# Patient Record
Sex: Female | Born: 1960 | Race: White | Hispanic: No | Marital: Married | Smoking: Former smoker
Health system: Southern US, Community
[De-identification: ages and names within clinical notes are randomized; demographics above are authoritative.]

## PROBLEM LIST (undated history)

## (undated) DIAGNOSIS — F32A Depression, unspecified: Secondary | ICD-10-CM

## (undated) DIAGNOSIS — G510 Bell's palsy: Secondary | ICD-10-CM

## (undated) DIAGNOSIS — Z973 Presence of spectacles and contact lenses: Secondary | ICD-10-CM

## (undated) DIAGNOSIS — N83209 Unspecified ovarian cyst, unspecified side: Secondary | ICD-10-CM

## (undated) DIAGNOSIS — E039 Hypothyroidism, unspecified: Secondary | ICD-10-CM

## (undated) DIAGNOSIS — F329 Major depressive disorder, single episode, unspecified: Secondary | ICD-10-CM

## (undated) DIAGNOSIS — R519 Headache, unspecified: Secondary | ICD-10-CM

## (undated) DIAGNOSIS — G43909 Migraine, unspecified, not intractable, without status migrainosus: Secondary | ICD-10-CM

## (undated) DIAGNOSIS — F419 Anxiety disorder, unspecified: Secondary | ICD-10-CM

## (undated) DIAGNOSIS — T7840XA Allergy, unspecified, initial encounter: Secondary | ICD-10-CM

## (undated) DIAGNOSIS — R112 Nausea with vomiting, unspecified: Secondary | ICD-10-CM

## (undated) DIAGNOSIS — E785 Hyperlipidemia, unspecified: Secondary | ICD-10-CM

## (undated) DIAGNOSIS — K219 Gastro-esophageal reflux disease without esophagitis: Secondary | ICD-10-CM

## (undated) DIAGNOSIS — G4700 Insomnia, unspecified: Secondary | ICD-10-CM

## (undated) DIAGNOSIS — R51 Headache: Secondary | ICD-10-CM

## (undated) DIAGNOSIS — K8020 Calculus of gallbladder without cholecystitis without obstruction: Secondary | ICD-10-CM

## (undated) HISTORY — DX: Nausea with vomiting, unspecified: R11.2

## (undated) HISTORY — DX: Gastro-esophageal reflux disease without esophagitis: K21.9

## (undated) HISTORY — PX: WISDOM TOOTH EXTRACTION: SHX21

## (undated) HISTORY — DX: Unspecified ovarian cyst, unspecified side: N83.209

## (undated) HISTORY — DX: Migraine, unspecified, not intractable, without status migrainosus: G43.909

## (undated) HISTORY — DX: Major depressive disorder, single episode, unspecified: F32.9

## (undated) HISTORY — PX: LAPAROSCOPIC ENDOMETRIOSIS FULGURATION: SUR769

## (undated) HISTORY — DX: Insomnia, unspecified: G47.00

## (undated) HISTORY — DX: Anxiety disorder, unspecified: F41.9

## (undated) HISTORY — PX: ABDOMINAL HYSTERECTOMY: SHX81

## (undated) HISTORY — DX: Headache, unspecified: R51.9

## (undated) HISTORY — DX: Bell's palsy: G51.0

## (undated) HISTORY — DX: Depression, unspecified: F32.A

## (undated) HISTORY — DX: Hyperlipidemia, unspecified: E78.5

## (undated) HISTORY — DX: Allergy, unspecified, initial encounter: T78.40XA

## (undated) HISTORY — DX: Hypothyroidism, unspecified: E03.9

## (undated) HISTORY — PX: CHOLECYSTECTOMY: SHX55

## (undated) HISTORY — DX: Headache: R51

## (undated) HISTORY — DX: Calculus of gallbladder without cholecystitis without obstruction: K80.20

## (undated) HISTORY — DX: Presence of spectacles and contact lenses: Z97.3

---

## 1999-03-04 ENCOUNTER — Ambulatory Visit (HOSPITAL_COMMUNITY): Admission: RE | Admit: 1999-03-04 | Discharge: 1999-03-04 | Payer: Self-pay | Admitting: Family Medicine

## 1999-03-04 ENCOUNTER — Encounter: Payer: Self-pay | Admitting: Family Medicine

## 1999-05-12 ENCOUNTER — Encounter (INDEPENDENT_AMBULATORY_CARE_PROVIDER_SITE_OTHER): Payer: Self-pay | Admitting: Specialist

## 1999-05-12 ENCOUNTER — Inpatient Hospital Stay (HOSPITAL_COMMUNITY): Admission: RE | Admit: 1999-05-12 | Discharge: 1999-05-14 | Payer: Self-pay | Admitting: Obstetrics and Gynecology

## 2000-07-12 ENCOUNTER — Ambulatory Visit (HOSPITAL_COMMUNITY): Admission: RE | Admit: 2000-07-12 | Discharge: 2000-07-12 | Payer: Self-pay | Admitting: *Deleted

## 2000-07-12 ENCOUNTER — Encounter: Payer: Self-pay | Admitting: *Deleted

## 2000-08-03 ENCOUNTER — Encounter: Admission: RE | Admit: 2000-08-03 | Discharge: 2000-08-03 | Payer: Self-pay | Admitting: Family Medicine

## 2000-08-03 ENCOUNTER — Encounter: Payer: Self-pay | Admitting: Family Medicine

## 2000-08-21 ENCOUNTER — Encounter: Payer: Self-pay | Admitting: Neurology

## 2000-08-21 ENCOUNTER — Ambulatory Visit (HOSPITAL_COMMUNITY): Admission: RE | Admit: 2000-08-21 | Discharge: 2000-08-21 | Payer: Self-pay | Admitting: Neurology

## 2002-01-21 ENCOUNTER — Other Ambulatory Visit: Admission: RE | Admit: 2002-01-21 | Discharge: 2002-01-21 | Payer: Self-pay | Admitting: Obstetrics and Gynecology

## 2002-09-09 ENCOUNTER — Encounter: Payer: Self-pay | Admitting: Obstetrics and Gynecology

## 2002-09-09 ENCOUNTER — Encounter (INDEPENDENT_AMBULATORY_CARE_PROVIDER_SITE_OTHER): Payer: Self-pay | Admitting: *Deleted

## 2002-09-09 ENCOUNTER — Encounter: Admission: RE | Admit: 2002-09-09 | Discharge: 2002-09-09 | Payer: Self-pay | Admitting: Obstetrics and Gynecology

## 2002-09-11 ENCOUNTER — Other Ambulatory Visit: Admission: RE | Admit: 2002-09-11 | Discharge: 2002-09-11 | Payer: Self-pay | Admitting: Radiology

## 2002-11-05 ENCOUNTER — Encounter: Payer: Self-pay | Admitting: Family Medicine

## 2002-11-05 ENCOUNTER — Ambulatory Visit (HOSPITAL_COMMUNITY): Admission: RE | Admit: 2002-11-05 | Discharge: 2002-11-05 | Payer: Self-pay | Admitting: Family Medicine

## 2003-06-16 ENCOUNTER — Other Ambulatory Visit: Admission: RE | Admit: 2003-06-16 | Discharge: 2003-06-16 | Payer: Self-pay | Admitting: Obstetrics and Gynecology

## 2003-11-04 ENCOUNTER — Ambulatory Visit (HOSPITAL_COMMUNITY): Admission: RE | Admit: 2003-11-04 | Discharge: 2003-11-04 | Payer: Self-pay | Admitting: Family Medicine

## 2005-04-04 ENCOUNTER — Other Ambulatory Visit: Admission: RE | Admit: 2005-04-04 | Discharge: 2005-04-04 | Payer: Self-pay | Admitting: Obstetrics and Gynecology

## 2007-05-22 ENCOUNTER — Encounter: Admission: RE | Admit: 2007-05-22 | Discharge: 2007-05-22 | Payer: Self-pay | Admitting: Obstetrics and Gynecology

## 2007-06-22 ENCOUNTER — Emergency Department (HOSPITAL_COMMUNITY): Admission: EM | Admit: 2007-06-22 | Discharge: 2007-06-22 | Payer: Self-pay | Admitting: Emergency Medicine

## 2010-12-27 ENCOUNTER — Emergency Department (HOSPITAL_COMMUNITY)
Admission: EM | Admit: 2010-12-27 | Discharge: 2010-12-27 | Disposition: A | Discharge status: Home / Self Care | Payer: Managed Care, Other (non HMO) | Attending: Emergency Medicine | Admitting: Emergency Medicine

## 2010-12-27 ENCOUNTER — Emergency Department (HOSPITAL_COMMUNITY): Payer: Managed Care, Other (non HMO)

## 2010-12-27 DIAGNOSIS — R109 Unspecified abdominal pain: Secondary | ICD-10-CM | POA: Insufficient documentation

## 2010-12-27 LAB — DIFFERENTIAL
Basophils Absolute: 0 10*3/uL (ref 0.0–0.1)
Basophils Relative: 0 % (ref 0–1)
Eosinophils Absolute: 0.1 10*3/uL (ref 0.0–0.7)
Eosinophils Relative: 1 % (ref 0–5)
Lymphocytes Relative: 14 % (ref 12–46)
Lymphs Abs: 1.7 10*3/uL (ref 0.7–4.0)
Monocytes Absolute: 0.9 10*3/uL (ref 0.1–1.0)
Monocytes Relative: 7 % (ref 3–12)
Neutro Abs: 9.1 10*3/uL — ABNORMAL HIGH (ref 1.7–7.7)
Neutrophils Relative %: 78 % — ABNORMAL HIGH (ref 43–77)

## 2010-12-27 LAB — BASIC METABOLIC PANEL
BUN: 13 mg/dL (ref 6–23)
Chloride: 106 mEq/L (ref 96–112)
Potassium: 3.8 mEq/L (ref 3.5–5.1)
Sodium: 138 mEq/L (ref 135–145)

## 2010-12-27 LAB — URINALYSIS, ROUTINE W REFLEX MICROSCOPIC
Glucose, UA: NEGATIVE mg/dL
Specific Gravity, Urine: 1.015 (ref 1.005–1.030)
pH: 8.5 — ABNORMAL HIGH (ref 5.0–8.0)

## 2010-12-27 LAB — CBC
HCT: 43 % (ref 36.0–46.0)
Hemoglobin: 15.5 g/dL — ABNORMAL HIGH (ref 12.0–15.0)
MCH: 33 pg (ref 26.0–34.0)
MCHC: 36 g/dL (ref 30.0–36.0)
MCV: 91.5 fL (ref 78.0–100.0)
Platelets: 360 10*3/uL (ref 150–400)
RBC: 4.7 MIL/uL (ref 3.87–5.11)
RDW: 12.2 % (ref 11.5–15.5)
WBC: 11.8 10*3/uL — ABNORMAL HIGH (ref 4.0–10.5)

## 2011-02-13 NOTE — H&P (Addendum)
Margaret, Robles                ACCOUNT NO.:  [id number]  MEDICAL RECORD NO.:  [id number]          PATIENT TYPE:  LOCATION:                                 FACILITY:  PHYSICIAN:  Margaret Robles. Margaret Robles, M.D.DATE OF BIRTH:  1960/11/24  DATE OF ADMISSION: DATE OF DISCHARGE:                             HISTORY & PHYSICAL   CHIEF COMPLAINT:  Left low back pain.  BRIEF HISTORY:  [name] has been followed by Dr. Darrelyn Robles for pain in her left low back and down the left leg since October of last year.  She has been treated conservatively with anti-inflammatories but fortunately without relief.  Dr. Darrelyn Robles obtained an MRI of the lumbar spine that revealed severe spinal stenosis with moderate paracentral disk extrusion at L4-L5.  She now presents for lumbar surgery.  DRUG ALLERGIES:  No known drug allergies.  MEDICATIONS: 1. Losartan/hydrochlorothiazide. 2. TriCor. 3. Os-Cal. 4. Aleve. 5. Advil. 6. Estradiol.  PAST SURGICAL HISTORY:  Partial hysterectomy, cholecystectomy, thyroidectomy, right rotator cuff repair.  She notes that she has had nausea with anesthesia in the past.  PAST MEDICAL HISTORY:  Positive for hypertension and arthritis.  SOCIAL HISTORY:  The patient denies use of alcohol or tobacco products, and she is retired.  Please note the patient does plan to go home following her hospital stay.  FAMILY HISTORY:  Father passed at the age of 58.  He had heart disease. Mother passed at age of 74 of cancer.  REVIEW OF SYSTEMS:  GENERAL:  Negative fevers, chills or weight change. HEENT:  Negative for headache or blurred vision.  RESPIRATORY:  Negative for shortness of breath.  GI:  Negative for reflux disease or ulcer. GU:  Negative for hematuria.  CARDIOVASCULAR:  History of hypertension. No current chest pain.  MUSCULOSKELETAL:  Positive for low back pain. HEMATOPOIETIC:  Negative for bleeding disorders.  Remainder of review of systems noncontributory.  PHYSICAL  EXAMINATION:  VITAL SIGNS:  Pulse 80, respirations 18, blood pressure 122/84 in the left arm. GENERAL:  [name] is alert and oriented x3.  She is well-developed, well-nourished, in no apparent distress.  She is a pleasant 50 year old female accompanied today by her husband. HEENT:  Normocephalic, atraumatic.  Extraocular movements intact.  The patient wears reading glasses and has dentures both on top and bottom. NECK:  Supple.  Full range of motion without lymphadenopathy. CHEST:  Lungs are clear to auscultation bilaterally without wheezes, rhonchi or rales. HEART:  Regular rate and rhythm. ABDOMEN:  Bowel sounds present in all 4 quadrants. EXTREMITIES:  She has some discomfort with knee extension on the left. She has pain with palpation of the lumbar spine.  She also has pain with range of motion of the lumbar spine, especially flexion and extension. NEUROLOGIC:  Decreased sensation on the lateral aspect of the left lower leg and she has some weakness in the left foot dorsiflexors. SKIN:  Unremarkable.  Peripheral vascular, carotid pulses 2+ bilaterally without bruit.  IMAGING:  Again MRI revealed severe canal stenosis at L4-L5 with moderate central and paracentral extrusion.  IMPRESSION:  Spinal stenosis.  PLAN:  Decompressive lumbar surgery  at L4-L5 to be performed by Dr. Darrelyn Robles.     Margaret Robles, PAC   ______________________________ Margaret Robles Margaret Robles, M.D.    LD/MEDQ  D:  02/10/2011  T:  02/10/2011  Job:  [id number]  Electronically Signed by Margaret Robles  on 02/13/2011 03:37:23 PM Electronically Signed by Margaret Robles M.D. on 02/24/2011 07:30:39 AM

## 2011-04-17 ENCOUNTER — Ambulatory Visit (INDEPENDENT_AMBULATORY_CARE_PROVIDER_SITE_OTHER): Payer: Managed Care, Other (non HMO) | Admitting: General Surgery

## 2011-04-17 ENCOUNTER — Encounter (INDEPENDENT_AMBULATORY_CARE_PROVIDER_SITE_OTHER): Payer: Self-pay | Admitting: General Surgery

## 2011-04-17 VITALS — BP 110/88 | HR 80 | Temp 97.6°F | Ht 66.0 in | Wt 174.6 lb

## 2011-04-17 DIAGNOSIS — K8020 Calculus of gallbladder without cholecystitis without obstruction: Secondary | ICD-10-CM

## 2011-04-17 NOTE — Progress Notes (Signed)
Margaret Robles is a 50 y.o. female.    Chief Complaint  Patient presents with  . Other    new pt- eval of GB    HPI HPI This patient was referred for evaluation of right-sided abdominal pain which has been present since April. She has a history of kidney stones and in April presented to the emergency room at [facility] for evaluation. She had been having 4 hours of constant abdominal pain which led to nausea and subsequent vomiting. She had a CT scan of the abdomen at that time which demonstrated a small kidney stone as well as cholelithiasis without evidence of cholecystitis. She was referred for evaluation of the kidney stone and recently saw Dr. Michiel Cowboy for evaluation. She performed an IVP which did not show any kidney stones and she was cleared from urology. She saw Dr. Bevelyn Buckles back, her primary physician, and she was still having right-sided to right upper quadrant abdominal pain which radiated through to her back to her right shoulder blade. She states that this is a steady pain and "constant" which is worse with running and with coffee and fatty foods. She describes it as pressure and pushing and only gets relief with Dilaudid which she takes daily for pain. She denies any fevers. She did have chills with one episode. She denies any jaundice or heartburn. She has been having the nausea and worse pain with certain foods which she has just learned to avoid. She has had 8 pound weight loss due to the fear of these fatty foods. She denies any history of colonoscopy.  Past Medical History  Diagnosis Date  . Gall stones   . Abdominal pain   . Ovarian cyst   . Nausea & vomiting   . Arthritis   . Wears glasses   . Allergy   Depression/anxiety  Past Surgical History  Procedure Date  . Abdominal hysterectomy   . Laparoscopic endometriosis fulguration     Family History  Problem Relation Age of Onset  . Cancer Mother     lung  . Heart disease Father   . Diabetes Father    . Heart disease Brother   . Diabetes Brother     Social History History  Substance Use Topics  . Smoking status: Former Games developer  . Smokeless tobacco: Not on file  . Alcohol Use: Yes    No Known Allergies  Current Outpatient Prescriptions  Medication Sig Dispense Refill  . buPROPion (WELLBUTRIN SR) 150 MG 12 hr tablet Take 150 mg by mouth 3 (three) times daily.        Marland Kitchen HYDROmorphone (DILAUDID) 2 MG tablet Take 2 mg by mouth every 4 (four) hours as needed.        . sertraline (ZOLOFT) 100 MG tablet Daily.        Review of Systems Review of Systems  Constitutional: Negative.   HENT: Positive for congestion. Negative for ear pain and sore throat.   Eyes: Negative.   Respiratory: Negative.  Negative for stridor.   Cardiovascular: Negative.   Gastrointestinal: Negative.   Genitourinary: Positive for flank pain. Negative for urgency, frequency and hematuria.  Musculoskeletal: Positive for myalgias and joint pain. Negative for falls.  Skin: Negative.   Neurological: Negative.   Endo/Heme/Allergies: Negative.   Psychiatric/Behavioral: Negative.     Physical Exam Physical Exam  Constitutional: She is oriented to person, place, and time. She appears well-developed and well-nourished. No distress.  HENT:  Head: Normocephalic and atraumatic.  Mouth/Throat:  No oropharyngeal exudate.  Eyes: EOM are normal. Pupils are equal, round, and reactive to light. Right eye exhibits no discharge. Left eye exhibits no discharge. No scleral icterus.  Neck: Normal range of motion. Neck supple. No tracheal deviation present.  Cardiovascular: Normal rate, regular rhythm and normal heart sounds.   Respiratory: Effort normal and breath sounds normal. No stridor. She has no wheezes. She has no rales.  GI: Soft. Bowel sounds are normal. She exhibits no distension and no mass. There is tenderness. There is no rebound and no guarding.       RUQ tenderness, no murphy's  Musculoskeletal: Normal range of  motion. She exhibits no edema and no tenderness.  Neurological: She is alert and oriented to person, place, and time. She has normal reflexes.  Skin: Skin is warm and dry. No rash noted. She is not diaphoretic. No erythema. No pallor.  Psychiatric: She has a normal mood and affect. Her behavior is normal. Judgment and thought content normal.     Blood pressure 110/88, pulse 80, temperature 97.6 F (36.4 C), height 5\' 6"  (1.676 m), weight 174 lb 9.6 oz (79.198 kg).  Assessment/Plan Abdominal pain in the right upper quadrant which is most likely due to symptomatic cholelithiasis  Her symptoms to sound like they are from symptomatic cholelithiasis. She didn't have any signs of cholecystitis. I discussed with her the options of nonoperative therapy versus surgical intervention with cholecystectomy. We discussed the surgical procedure as well as the risks including infection, bleeding pain scarring, persistent symptoms, diarrhea, injury to bowel or bile ducts, and need for open surgery and she expressed understanding and desires to proceed with laparoscopic cholecystectomy. We will schedule this as soon as possible.  Lodema Pilot DAVID 04/17/2011, 11:52 AM

## 2011-04-18 ENCOUNTER — Other Ambulatory Visit (HOSPITAL_COMMUNITY): Payer: Managed Care, Other (non HMO)

## 2011-04-20 ENCOUNTER — Other Ambulatory Visit (INDEPENDENT_AMBULATORY_CARE_PROVIDER_SITE_OTHER): Payer: Self-pay | Admitting: General Surgery

## 2011-04-20 ENCOUNTER — Encounter (HOSPITAL_COMMUNITY): Payer: Managed Care, Other (non HMO)

## 2011-04-20 LAB — COMPREHENSIVE METABOLIC PANEL
AST: 14 U/L (ref 0–37)
Alkaline Phosphatase: 82 U/L (ref 39–117)
BUN: 16 mg/dL (ref 6–23)
CO2: 30 mEq/L (ref 19–32)
Chloride: 100 mEq/L (ref 96–112)
Creatinine, Ser: 1.08 mg/dL (ref 0.50–1.10)
GFR calc non Af Amer: 54 mL/min — ABNORMAL LOW (ref >60)
Potassium: 4.6 mEq/L (ref 3.5–5.1)
Total Bilirubin: 0.5 mg/dL (ref 0.3–1.2)

## 2011-04-20 LAB — CBC
HCT: 44.3 % (ref 36.0–46.0)
MCV: 93.1 fL (ref 78.0–100.0)
Platelets: 324 10*3/uL (ref 150–400)
RBC: 4.76 MIL/uL (ref 3.87–5.11)
WBC: 6.7 10*3/uL (ref 4.0–10.5)

## 2011-04-20 LAB — DIFFERENTIAL
Basophils Absolute: 0 10*3/uL (ref 0.0–0.1)
Lymphocytes Relative: 32 % (ref 12–46)
Lymphs Abs: 2.2 10*3/uL (ref 0.7–4.0)
Neutro Abs: 3.7 10*3/uL (ref 1.7–7.7)
Neutrophils Relative %: 55 % (ref 43–77)

## 2011-04-20 LAB — SURGICAL PCR SCREEN: Staphylococcus aureus: NEGATIVE

## 2011-04-26 ENCOUNTER — Ambulatory Visit (HOSPITAL_COMMUNITY): Payer: Managed Care, Other (non HMO)

## 2011-04-26 ENCOUNTER — Ambulatory Visit (HOSPITAL_COMMUNITY)
Admission: RE | Admit: 2011-04-26 | Discharge: 2011-04-26 | Disposition: A | Discharge status: Home / Self Care | Payer: Managed Care, Other (non HMO) | Source: Ambulatory Visit | Attending: General Surgery | Admitting: General Surgery

## 2011-04-26 ENCOUNTER — Other Ambulatory Visit (INDEPENDENT_AMBULATORY_CARE_PROVIDER_SITE_OTHER): Payer: Self-pay | Admitting: General Surgery

## 2011-04-26 DIAGNOSIS — K8010 Calculus of gallbladder with chronic cholecystitis without obstruction: Secondary | ICD-10-CM | POA: Insufficient documentation

## 2011-04-26 DIAGNOSIS — F329 Major depressive disorder, single episode, unspecified: Secondary | ICD-10-CM | POA: Insufficient documentation

## 2011-04-26 DIAGNOSIS — F3289 Other specified depressive episodes: Secondary | ICD-10-CM | POA: Insufficient documentation

## 2011-04-26 DIAGNOSIS — Z01812 Encounter for preprocedural laboratory examination: Secondary | ICD-10-CM | POA: Insufficient documentation

## 2011-05-06 NOTE — Op Note (Signed)
NAME:  Margaret Robles, Margaret Robles NO.:  [id number]  MEDICAL RECORD NO.:  [id number]  LOCATION:  [facility]                         FACILITY:  [facility]  PHYSICIAN:  Lodema Pilot, MD       DATE OF BIRTH:  Jun 28, 1961  DATE OF PROCEDURE:  04/26/2011 DATE OF DISCHARGE:  04/26/2011                              OPERATIVE REPORT   PROCEDURE:  Laparoscopic cholecystectomy with intraoperative cholangiogram.  PREOPERATIVE DIAGNOSIS:  Symptomatic cholelithiasis.  POSTOPERATIVE DIAGNOSIS:  Symptomatic cholelithiasis.  SURGEON:  Lodema Pilot, MD  ASSISTANT:  Ollen Gross. Vernell Morgans, MD  ANESTHESIA:  General endotracheal anesthesia with 35 cc of 1% lidocaine with epinephrine, 0.25% Marcaine injected at 50/50 mixture.  FLUIDS:  300 cc of crystalloid.  ESTIMATED BLOOD LOSS:  Minimal.  DRAINS:  None.  SPECIMENS:  Gallbladder and contents sent to Pathology for permanent sectioning.  FINDINGS:  Thickened peritoneum with some omental adhesions overlying the gallbladder, consistent with chronic cholecystitis, short cystic duct. Otherwise, normal cholangiogram with no filling defects in the common bile duct and free flow of bile into the duodenum, right and left hepatic ducts visualized.  INDICATION FOR PROCEDURE:  [name] is a 50 year old female with right upper quadrant pain, associated with eating and she had ultrasound, consistent with symptomatic cholelithiasis.  She had a CT scan which demonstrated cholelithiasis.  She underwent workup for kidney stones and when this was negative, her right-sided pain radiating to her shoulder was thought to be due to her symptomatic cholelithiasis.  OPERATIVE DETAILS:  [name] was seen and evaluated in the preoperative area and risks and benefits of procedure were again discussed in lay terms and informed consent was obtained.  Prophylactic antibiotics were given and she was taken to the operating room, placed on table in a supine position.   General endotracheal anesthesia was obtained and her abdomen was prepped and draped in a standard surgical fashion.  Her prior infraumbilical incision was used to access the abdomen in Hasson technique and pneumoperitoneum was obtained. Laparoscope was introduced in the abdomen and there was no evidence of bowel injury upon entry.  Two 5 mm right upper quadrant trocars were placed and an 11 mm epigastric trocar was placed under direct visualization and there were omental fatty adhesions adhered to the liver capsule, surrounding the gallbladder and the gallbladder was seen, had a thickened rind, consistent with chronic cholecystitis. Gallbladder was retracted cephalad and the omental adhesions were divided with sharp dissection.  As the gallbladder was retracted cephalad, the peritoneum overlying the gallbladder was taken down with blunt dissection.  This allowed [place] to visualize the cystic duct and we skeletonized the cystic duct and the triangle of Calot with a fairly small cystic duct, short in appearance, and also small in caliber. After this was skeletonized and a critical view of safety was obtained, a clip was placed on the gallbladder and small cystic ductotomy was made and a cholangiogram was performed. Through a separate stab incision, catheter was placed into the cystic duct.  Cholangiogram revealed a short cystic duct, but no filling defects in the common bile duct and free flow of bile in the duodenum and normal filling in the right  and left hepatic ducts.  The cholangiogram catheter was removed and 2 clips were placed on each side of the cystic duct and the duct was transected. Small artery coursing up to the gallbladder was divided between clips and the gallbladder was further dissected free from the gallbladder fossa.  There was another larger cystic artery which was identified coursing up onto the gallbladder and this was transected between Hemoclips.  The gallbladder was  removed from the gallbladder fossa using Bovie electrocautery. There was some leakage of bile from the gallbladder during the dissection, but the gallbladder was completely removed from the gallbladder fossa and the gallbladder fossa was noted to be hemostatic.  It was removed from the abdomen through the umbilicus in an EndoCatch bag.  It was opened up on the back table, examined, and noted to have 2 moderate size gallstones with a single cystic duct. Specimen was passed off the table and sent to Pathology for permanent sectioning.  The right upper quadrant was irrigated with sterile saline solution until the irrigation returned clear and again the gallbladder fossa was noted to be hemostatic.  The right upper quadrant trocars were removed under direct visualization and the abdominal wall noted to be hemostatic.  The umbilical trocars removed and the fascia was approximated with interrupted 0 Vicryl sutures.  The sutures were secured and the abdomen was re-insufflated with a gas and the umbilical trocar closure was inspected and no evidence of bowel injury was identified with good closure of the fascia and the right upper quadrant was noted to hemostatic.  No evidence of bowel injury or bleeding.  The epigastric trocar was removed and the skin was anesthetized with 35 cc of 1% lidocaine with epinephrine and 0.25% Marcaine in a 50/50 mixture. Skin edges were approximated with a 4-0 Monocryl subcuticular suture. Skin was washed and dried and Dermabond was applied.  All sponge, needle, and instrument counts were correct at the end of the case and the patient tolerated the procedure well without apparent complications.          ______________________________ Lodema Pilot, MD     BL/MEDQ  D:  04/26/2011  T:  04/27/2011  Job:  [id number]  Electronically Signed by Lodema Pilot DO on 05/06/2011 06:01:58 PM

## 2011-05-16 ENCOUNTER — Encounter (INDEPENDENT_AMBULATORY_CARE_PROVIDER_SITE_OTHER): Payer: Self-pay | Admitting: General Surgery

## 2011-05-16 ENCOUNTER — Ambulatory Visit (INDEPENDENT_AMBULATORY_CARE_PROVIDER_SITE_OTHER): Payer: Managed Care, Other (non HMO) | Admitting: General Surgery

## 2011-05-16 VITALS — BP 128/74 | HR 58 | Temp 97.6°F

## 2011-05-16 DIAGNOSIS — Z4889 Encounter for other specified surgical aftercare: Secondary | ICD-10-CM

## 2011-05-16 DIAGNOSIS — Z5189 Encounter for other specified aftercare: Secondary | ICD-10-CM

## 2011-05-16 NOTE — Progress Notes (Signed)
Subjective:     Patient ID: Margaret Robles, female   DOB: [birth year], 50 y.o.   MRN: [id number]  HPI This patient follows up 3 weeks status post left upper cholecystectomy. She states she is doing very well and has no food intolerances. Her symptoms preoperative have now resolved. She states that she is eating well with no limitations and her bowels are functioning normal. She is returned to the gym and denies any pain. Her pathology was benign.  Review of Systems     Objective:   Physical Exam No acute chest and nontoxic appearing  Her abdomen is soft, nontender, nondistended, incisions are healing well without infection.    Assessment:     S/p lap cholecystectomy, doing well    Plan:     She can return to activity as tolerated. No limitations. Pathology is benign. Followup p.r.n.

## 2011-12-09 ENCOUNTER — Emergency Department (HOSPITAL_COMMUNITY)
Admission: EM | Admit: 2011-12-09 | Discharge: 2011-12-09 | Disposition: A | Discharge status: Home / Self Care | Payer: 59 | Attending: Emergency Medicine | Admitting: Emergency Medicine

## 2011-12-09 ENCOUNTER — Encounter (HOSPITAL_COMMUNITY): Payer: Self-pay

## 2011-12-09 DIAGNOSIS — R112 Nausea with vomiting, unspecified: Secondary | ICD-10-CM | POA: Insufficient documentation

## 2011-12-09 DIAGNOSIS — R51 Headache: Secondary | ICD-10-CM | POA: Insufficient documentation

## 2011-12-09 MED ORDER — ONDANSETRON HCL 4 MG/2ML IJ SOLN
INTRAMUSCULAR | Status: AC
  Administered 2011-12-09: 4 mg
  Filled 2011-12-09: qty 2

## 2011-12-09 MED ORDER — KETOROLAC TROMETHAMINE 30 MG/ML IJ SOLN
30.0000 mg | Freq: Once | INTRAMUSCULAR | Status: AC
  Administered 2011-12-09: 30 mg via INTRAVENOUS
  Filled 2011-12-09: qty 1

## 2011-12-09 MED ORDER — SODIUM CHLORIDE 0.9 % IV BOLUS (SEPSIS)
1000.0000 mL | Freq: Once | INTRAVENOUS | Status: AC
  Administered 2011-12-09: 1000 mL via INTRAVENOUS

## 2011-12-09 MED ORDER — DIPHENHYDRAMINE HCL 50 MG/ML IJ SOLN
25.0000 mg | Freq: Once | INTRAMUSCULAR | Status: AC
  Administered 2011-12-09: 25 mg via INTRAVENOUS
  Filled 2011-12-09: qty 1

## 2011-12-09 MED ORDER — HYDROCODONE-ACETAMINOPHEN 5-325 MG PO TABS: 1.0000 | ORAL_TABLET | ORAL | Status: AC | PRN

## 2011-12-09 MED ORDER — HYDROMORPHONE HCL PF 1 MG/ML IJ SOLN
1.0000 mg | Freq: Once | INTRAMUSCULAR | Status: AC
  Administered 2011-12-09: 1 mg via INTRAVENOUS
  Filled 2011-12-09: qty 1

## 2011-12-09 MED ORDER — ONDANSETRON HCL 4 MG/2ML IJ SOLN: 4.0000 mg | Freq: Once | INTRAMUSCULAR | Status: DC

## 2011-12-09 MED ORDER — PROMETHAZINE HCL 25 MG PO TABS: 12.5000 mg | ORAL_TABLET | Freq: Four times a day (QID) | ORAL | Status: AC | PRN

## 2011-12-09 NOTE — Discharge Instructions (Signed)
Drink lot of fluids. Use for pain and nausea medicine as needed. Follow up with her doctor.    Headache, General, Unknown Cause The specific cause of your headache may not have been found today. There are many causes and types of headache. A few common ones are:  Tension headache.   Migraine.   Infections (examples: dental and sinus infections).   Bone and/or joint problems in the neck or jaw.   Depression.   Eye problems.  These headaches are not life threatening.  Headaches can sometimes be diagnosed by a patient history and a physical exam. Sometimes, lab and imaging studies (such as x-ray and/or CT scan) are used to rule out more serious problems. In some cases, a spinal tap (lumbar puncture) may be requested. There are many times when your exam and tests may be normal on the first visit even when there is a serious problem causing your headaches. Because of that, it is very important to follow up with your doctor or local clinic for further evaluation. FINDING OUT THE RESULTS OF TESTS  If a radiology test was performed, a radiologist will review your results.   You will be contacted by the emergency department or your physician if any test results require a change in your treatment plan.   Not all test results may be available during your visit. If your test results are not back during the visit, make an appointment with your caregiver to find out the results. Do not assume everything is normal if you have not heard from your caregiver or the medical facility. It is important for you to follow up on all of your test results.  HOME CARE INSTRUCTIONS   Keep follow-up appointments with your caregiver, or any specialist referral.   Only take over-the-counter or prescription medicines for pain, discomfort, or fever as directed by your caregiver.   Biofeedback, massage, or other relaxation techniques may be helpful.   Ice packs or heat applied to the head and neck can be used. Do  this three to four times per day, or as needed.   Call your doctor if you have any questions or concerns.   If you smoke, you should quit.  SEEK MEDICAL CARE IF:   You develop problems with medications prescribed.   You do not respond to or obtain relief from medications.   You have a change from the usual headache.   You develop nausea or vomiting.  SEEK IMMEDIATE MEDICAL CARE IF:   If your headache becomes severe.   You have an unexplained oral temperature above 102 F (38.9 C), or as your caregiver suggests.   You have a stiff neck.   You have loss of vision.   You have muscular weakness.   You have loss of muscular control.   You develop severe symptoms different from your first symptoms.   You start losing your balance or have trouble walking.   You feel faint or pass out.  MAKE SURE YOU:   Understand these instructions.   Will watch your condition.   Will get help right away if you are not doing well or get worse.  Document Released: 09/11/2005 Document Revised: 08/31/2011 Document Reviewed: 04/30/2008 [facility] Patient Information 2012 [place], [place].

## 2011-12-09 NOTE — ED Provider Notes (Signed)
History     CSN: [id number]  Arrival date & time 12/09/11  0136   First MD Initiated Contact with Patient 12/09/11 0220      Chief Complaint  Patient presents with  . Migraine    (Consider location/radiation/quality/duration/timing/severity/associated sxs/prior treatment) HPI Margaret Robles is a 51 y.o. female who presents to the Emergency Department complaining of headache, nausea and vomiting that began three hours ago. Patient states headache had been present for several hours and got progressively worse resulting in nausea and vomiting. It is associated with a throbbing quality, light sensitivity and noise sensitivity. She denies fever, chills, stiff neck, vision changes, hearing changes, trouble speaking or swallowing, numbness, tingling, weakness.  Past Medical History  Diagnosis Date  . Gall stones   . Abdominal pain   . Ovarian cyst   . Nausea & vomiting   . Arthritis   . Wears glasses   . Allergy     Past Surgical History  Procedure Date  . Abdominal hysterectomy   . Laparoscopic endometriosis fulguration   . Cholecystectomy     Family History  Problem Relation Age of Onset  . Cancer Mother     lung  . Heart disease Father   . Diabetes Father   . Heart disease Brother   . Diabetes Brother     History  Substance Use Topics  . Smoking status: Former Games developer  . Smokeless tobacco: Not on file  . Alcohol Use: Yes    OB History    Grav Para Term Preterm Abortions TAB SAB Ect Mult Living                  Review of Systems  Constitutional: Negative for fever.       10 Systems reviewed and are negative for acute change except as noted in the HPI.  HENT: Negative for congestion.   Eyes: Negative for discharge and redness.  Respiratory: Negative for cough and shortness of breath.   Cardiovascular: Negative for chest pain.  Gastrointestinal: Positive for nausea and vomiting. Negative for abdominal pain.  Musculoskeletal: Negative for back pain.  Skin:  Negative for rash.  Neurological: Positive for headaches. Negative for syncope and numbness.  Psychiatric/Behavioral:       No behavior change.    Allergies  Review of patient's allergies indicates no known allergies.  Home Medications   Current Outpatient Rx  Name Route Sig Dispense Refill  . BUPROPION HCL ER (SR) 150 MG PO TB12 Oral Take 150 mg by mouth 3 (three) times daily.      . SERTRALINE HCL 100 MG PO TABS  Daily.    Marland Kitchen HYDROCODONE-ACETAMINOPHEN 5-325 MG PO TABS Oral Take 1 tablet by mouth every 4 (four) hours as needed for pain. 15 tablet 0  . HYDROMORPHONE HCL 2 MG PO TABS Oral Take 2 mg by mouth every 4 (four) hours as needed.      Marland Kitchen PROMETHAZINE HCL 25 MG PO TABS Oral Take 0.5 tablets (12.5 mg total) by mouth every 6 (six) hours as needed for nausea. 10 tablet 0    There were no vitals taken for this visit.  Physical Exam Physical examination:  Nursing notes reviewed; Vital signs and O2 SAT reviewed;  Constitutional: Well developed, Well nourished, Well hydrated, In no acute distress; Head:  Normocephalic, atraumatic; Eyes: EOMI, PERRL, No scleral icterus; ENMT: Mouth and pharynx normal, Mucous membranes moist; Neck: Supple, Full range of motion, No lymphadenopathy; Cardiovascular: Regular rate and rhythm, No murmur, rub,  or gallop; Respiratory: Breath sounds clear & equal bilaterally, No rales, rhonchi, wheezes, or rub, Normal respiratory effort/excursion; Chest: Nontender, Movement normal; Abdomen: Soft, Nontender, Nondistended, Normal bowel sounds; Genitourinary: No CVA tenderness; Extremities: Pulses normal, No tenderness, No edema, No calf edema or asymmetry.; Neuro: AA&Ox3, Major CN grossly intact.  No gross focal motor or sensory deficits in extremities.; Skin: Color normal, Warm, Dry  ED Course  Procedures (including critical care time)    1. Headache       MDM  Patient with headache similar to past headaches. Given IVF, analgesic, antiemetic, benadryl, toradol,  with relief. Patient has taken PO fluids.  Pt feels improved after observation and/or treatment in ED.Pt stable in ED with no significant deterioration in condition.The patient appears reasonably screened and/or stabilized for discharge and I doubt any other medical condition or other [facility] requiring further screening, evaluation, or treatment in the ED at this time prior to discharge.  MDM Reviewed: nursing note and vitals           Nicoletta Dress. Colon Branch, MD 12/09/11 [phone]

## 2011-12-09 NOTE — ED Notes (Signed)
Migraine headache with nausea and vomiting

## 2012-04-23 ENCOUNTER — Other Ambulatory Visit: Payer: Self-pay | Admitting: Family Medicine

## 2012-04-23 DIAGNOSIS — Z1231 Encounter for screening mammogram for malignant neoplasm of breast: Secondary | ICD-10-CM

## 2012-05-07 ENCOUNTER — Ambulatory Visit
Admission: RE | Admit: 2012-05-07 | Discharge: 2012-05-07 | Disposition: A | Discharge status: Home / Self Care | Payer: 59 | Source: Ambulatory Visit | Attending: Family Medicine | Admitting: Family Medicine

## 2012-05-07 DIAGNOSIS — Z1231 Encounter for screening mammogram for malignant neoplasm of breast: Secondary | ICD-10-CM

## 2012-12-06 ENCOUNTER — Other Ambulatory Visit: Payer: Self-pay | Admitting: Neurosurgery

## 2012-12-06 DIAGNOSIS — M792 Neuralgia and neuritis, unspecified: Secondary | ICD-10-CM

## 2012-12-17 ENCOUNTER — Telehealth: Payer: Self-pay | Admitting: Nurse Practitioner

## 2012-12-17 NOTE — Telephone Encounter (Signed)
wtbs

## 2012-12-17 NOTE — Telephone Encounter (Signed)
Wants appt for vaginal pain appt given

## 2012-12-18 ENCOUNTER — Ambulatory Visit (INDEPENDENT_AMBULATORY_CARE_PROVIDER_SITE_OTHER): Payer: 59 | Admitting: Nurse Practitioner

## 2012-12-18 ENCOUNTER — Encounter: Payer: Self-pay | Admitting: Nurse Practitioner

## 2012-12-18 ENCOUNTER — Ambulatory Visit
Admission: RE | Admit: 2012-12-18 | Discharge: 2012-12-18 | Disposition: A | Discharge status: Home / Self Care | Payer: 59 | Source: Ambulatory Visit | Attending: Neurosurgery | Admitting: Neurosurgery

## 2012-12-18 VITALS — BP 121/75 | HR 84 | Temp 98.1°F | Ht 65.5 in | Wt 188.0 lb

## 2012-12-18 DIAGNOSIS — E785 Hyperlipidemia, unspecified: Secondary | ICD-10-CM

## 2012-12-18 DIAGNOSIS — F411 Generalized anxiety disorder: Secondary | ICD-10-CM | POA: Insufficient documentation

## 2012-12-18 DIAGNOSIS — G43909 Migraine, unspecified, not intractable, without status migrainosus: Secondary | ICD-10-CM

## 2012-12-18 DIAGNOSIS — F329 Major depressive disorder, single episode, unspecified: Secondary | ICD-10-CM

## 2012-12-18 DIAGNOSIS — F32A Depression, unspecified: Secondary | ICD-10-CM | POA: Insufficient documentation

## 2012-12-18 DIAGNOSIS — N9089 Other specified noninflammatory disorders of vulva and perineum: Secondary | ICD-10-CM

## 2012-12-18 DIAGNOSIS — M792 Neuralgia and neuritis, unspecified: Secondary | ICD-10-CM

## 2012-12-18 MED ORDER — TRIAMCINOLONE ACETONIDE 0.1 % EX CREA: TOPICAL_CREAM | Freq: Two times a day (BID) | CUTANEOUS | Status: DC

## 2012-12-18 NOTE — Progress Notes (Signed)
  Subjective:    Patient ID: Margaret Robles, female    DOB: [birth year], 52 y.o.   MRN: [id number]  HPI Patient in complaining of pain Vaginal Area. Started 2weekago. constant. Rates pain 7/10. Nothing Helps pain. clothing increases pain.      Review of Systems  Constitutional: Negative.   HENT: Negative.   Eyes: Negative.   Respiratory: Negative.   Cardiovascular: Negative.   Gastrointestinal: Negative.   Endocrine: Negative.   Genitourinary: Positive for vaginal discharge (watery) and vaginal pain. Negative for dysuria.       Vaginal odor  Allergic/Immunologic: Negative.   Neurological: Negative.   Hematological: Negative.   Psychiatric/Behavioral: Negative.         Objective:   Physical Exam  Constitutional: She is oriented to person, place, and time. She appears well-developed and well-nourished.  Cardiovascular: Normal rate, regular rhythm, normal heart sounds and intact distal pulses.   Pulmonary/Chest: Effort normal and breath sounds normal.  Genitourinary: No vaginal discharge found.  Labial adhesions bilaterally with tearing of adhesions on left. Lichen sclerosis noted bil labia  Musculoskeletal: Normal range of motion.  Neurological: She is alert and oriented to person, place, and time.  Skin: Skin is warm and dry.  Psychiatric: She has a normal mood and affect. Her behavior is normal. Judgment and thought content normal.    BP 121/75  Pulse 84  Temp(Src) 98.1 F (36.7 C) (Oral)  Ht 5' 5.5" (1.664 m)  Wt 188 lb (85.276 kg)  BMI 30.8 kg/m2  LMP 10/20/1998       Assessment & Plan:  Labial adhesions bil with lichen sclerosis Triamcinolone cream apply BID Avoid scratching F/U in 1 week  Mary-Margaret Daphine Deutscher, FNP

## 2012-12-18 NOTE — Patient Instructions (Signed)
Avoid scratching Cream as RX Re check at CPE visit

## 2012-12-31 ENCOUNTER — Encounter: Payer: Self-pay | Admitting: Internal Medicine

## 2012-12-31 ENCOUNTER — Ambulatory Visit (INDEPENDENT_AMBULATORY_CARE_PROVIDER_SITE_OTHER): Payer: 59 | Admitting: Nurse Practitioner

## 2012-12-31 ENCOUNTER — Encounter: Payer: Self-pay | Admitting: Nurse Practitioner

## 2012-12-31 VITALS — BP 116/78 | HR 77 | Temp 97.2°F | Ht 66.0 in | Wt 181.0 lb

## 2012-12-31 DIAGNOSIS — Z124 Encounter for screening for malignant neoplasm of cervix: Secondary | ICD-10-CM

## 2012-12-31 DIAGNOSIS — Z139 Encounter for screening, unspecified: Secondary | ICD-10-CM

## 2012-12-31 DIAGNOSIS — Z0000 Encounter for general adult medical examination without abnormal findings: Secondary | ICD-10-CM

## 2012-12-31 LAB — POCT CBC
Granulocyte percent: 60.6 %G (ref 37–80)
Lymph, poc: 1.7 (ref 0.6–3.4)
MCV: 92.3 fL (ref 80–97)
MPV: 7.8 fL (ref 0–99.8)
POC Granulocyte: 3.4 (ref 2–6.9)
POC LYMPH PERCENT: 30.9 %L (ref 10–50)
Platelet Count, POC: 345 10*3/uL (ref 142–424)
RBC: 4.6 M/uL (ref 4.04–5.48)
RDW, POC: 12.5 %
WBC: 5.6 10*3/uL (ref 4.6–10.2)

## 2012-12-31 LAB — POCT URINALYSIS DIPSTICK
Blood, UA: NEGATIVE
Glucose, UA: NEGATIVE
Ketones, UA: NEGATIVE
Spec Grav, UA: <=1.005
Urobilinogen, UA: NEGATIVE

## 2012-12-31 LAB — COMPLETE METABOLIC PANEL WITH GFR
ALT: 22 U/L (ref 0–35)
AST: 21 U/L (ref 0–37)
Albumin: 4.7 g/dL (ref 3.5–5.2)
Alkaline Phosphatase: 82 U/L (ref 39–117)
BUN: 14 mg/dL (ref 6–23)
Calcium: 10.2 mg/dL (ref 8.4–10.5)
Chloride: 105 mEq/L (ref 96–112)
Potassium: 4.7 mEq/L (ref 3.5–5.3)
Sodium: 140 mEq/L (ref 135–145)
Total Protein: 7.4 g/dL (ref 6.0–8.3)

## 2012-12-31 LAB — THYROID PANEL WITH TSH
Free Thyroxine Index: 3.5 (ref 1.0–3.9)
T3 Uptake: 35.8 % (ref 22.5–37.0)
TSH: 5.084 u[IU]/mL — ABNORMAL HIGH (ref 0.350–4.500)

## 2012-12-31 LAB — POCT UA - MICROSCOPIC ONLY
Bacteria, U Microscopic: NEGATIVE
Casts, Ur, LPF, POC: NEGATIVE
Crystals, Ur, HPF, POC: NEGATIVE
Mucus, UA: NEGATIVE
Yeast, UA: NEGATIVE

## 2012-12-31 NOTE — Progress Notes (Signed)
Subjective:    Patient ID: Margaret Robles, female    DOB: 10-13-1960, 52 y.o.   MRN: [id number]  HPI Patient here today for Cpe. She has no complaints today other than her labial adhesions that were found 2 weeks ago. She has been using triamcinolone cream which she says is not helping. No Known Allergies  Outpatient Encounter Prescriptions as of 12/31/2012  Medication Sig Dispense Refill  . buPROPion (WELLBUTRIN SR) 150 MG 12 hr tablet Take 150 mg by mouth 3 (three) times daily.        Marland Kitchen loratadine (CLARITIN) 10 MG tablet Take 10 mg by mouth daily.      . magnesium 30 MG tablet Take 30 mg by mouth 2 (two) times daily.      Marland Kitchen omeprazole (PRILOSEC) 20 MG capsule Take 20 mg by mouth daily.      . sertraline (ZOLOFT) 100 MG tablet Daily.      Marland Kitchen topiramate (TOPAMAX) 100 MG tablet Take 100 mg by mouth 2 (two) times daily.      Marland Kitchen triamcinolone cream (KENALOG) 0.1 % Apply topically 2 (two) times daily.  30 g  2  . HYDROmorphone (DILAUDID) 2 MG tablet Take 2 mg by mouth every 4 (four) hours as needed.         No facility-administered encounter medications on file as of 12/31/2012.    Past Medical History  Diagnosis Date  . Gall stones   . Abdominal pain   . Ovarian cyst   . Nausea & vomiting   . Wears glasses   . Allergy   . Hyperlipidemia   . Depression   . Anxiety     Past Surgical History  Procedure Laterality Date  . Abdominal hysterectomy    . Laparoscopic endometriosis fulguration    . Cholecystectomy      History   Social History  . Marital Status: Married    Spouse Name: N/A    Number of Children: N/A  . Years of Education: N/A   Occupational History  . Not on file.   Social History Main Topics  . Smoking status: Former Games developer  . Smokeless tobacco: Not on file  . Alcohol Use: Yes  . Drug Use: No  . Sexually Active: Yes    Birth Control/ Protection: Surgical   Other Topics Concern  . Not on file   Social History Narrative  . No narrative on file       Review of Systems  Constitutional: Negative.   HENT: Negative.   Eyes: Negative.   Respiratory: Negative.   Cardiovascular: Negative.   Gastrointestinal: Negative.   Endocrine: Negative.   Genitourinary: Negative.   Musculoskeletal: Negative.   Hematological: Negative.   Psychiatric/Behavioral: Negative.        Objective:   Physical Exam  Constitutional: She is oriented to person, place, and time. She appears well-developed and well-nourished.  HENT:  Nose: Nose normal.  Mouth/Throat: Oropharynx is clear and moist.  Eyes: EOM are normal.  Neck: Trachea normal, normal range of motion and full passive range of motion without pain. Neck supple. No JVD present. Carotid bruit is not present. No thyromegaly present.  Cardiovascular: Normal rate, regular rhythm, normal heart sounds and intact distal pulses.  Exam reveals no gallop and no friction rub.   No murmur heard. Pulmonary/Chest: Effort normal and breath sounds normal.  Abdominal: Soft. Bowel sounds are normal. She exhibits no distension and no mass. There is no tenderness.  Genitourinary: Vagina normal.  Labial  adhesions bil with 2 cm annular open wound  Musculoskeletal: Normal range of motion.  Lymphadenopathy:    She has no cervical adenopathy.  Neurological: She is alert and oriented to person, place, and time. She has normal reflexes.  Skin: Skin is warm and dry.  Psychiatric: She has a normal mood and affect. Her behavior is normal. Judgment and thought content normal.          Assessment & Plan:

## 2012-12-31 NOTE — Patient Instructions (Signed)

## 2013-01-01 ENCOUNTER — Other Ambulatory Visit: Payer: Self-pay | Admitting: Nurse Practitioner

## 2013-01-01 LAB — NMR LIPOPROFILE WITH LIPIDS
Cholesterol, Total: 211 mg/dL — ABNORMAL HIGH (ref <200)
HDL Particle Number: 30.9 umol/L (ref >=30.5)
LDL Particle Number: 1802 nmol/L — ABNORMAL HIGH (ref <1000)
LP-IR Score: 25 (ref <=45)
Large VLDL-P: 0.8 nmol/L (ref <=2.7)
Small LDL Particle Number: 732 nmol/L — ABNORMAL HIGH (ref <=527)
Triglycerides: 112 mg/dL (ref <150)
VLDL Size: 40.1 nm (ref <=46.6)

## 2013-01-01 LAB — PAP IG W/ RFLX HPV ASCU

## 2013-01-01 MED ORDER — LEVOTHYROXINE SODIUM 50 MCG PO TABS: 50.0000 ug | ORAL_TABLET | Freq: Every day | ORAL | Status: DC

## 2013-01-01 MED ORDER — ATORVASTATIN CALCIUM 40 MG PO TABS: 40.0000 mg | ORAL_TABLET | Freq: Every day | ORAL | Status: DC

## 2013-01-12 ENCOUNTER — Emergency Department (HOSPITAL_COMMUNITY): Payer: 59

## 2013-01-12 ENCOUNTER — Encounter (HOSPITAL_COMMUNITY): Payer: Self-pay | Admitting: Cardiology

## 2013-01-12 ENCOUNTER — Emergency Department (HOSPITAL_COMMUNITY)
Admission: EM | Admit: 2013-01-12 | Discharge: 2013-01-12 | Disposition: A | Discharge status: Home / Self Care | Payer: 59 | Attending: Emergency Medicine | Admitting: Emergency Medicine

## 2013-01-12 DIAGNOSIS — E785 Hyperlipidemia, unspecified: Secondary | ICD-10-CM | POA: Insufficient documentation

## 2013-01-12 DIAGNOSIS — G510 Bell's palsy: Secondary | ICD-10-CM

## 2013-01-12 DIAGNOSIS — IMO0002 Reserved for concepts with insufficient information to code with codable children: Secondary | ICD-10-CM | POA: Insufficient documentation

## 2013-01-12 DIAGNOSIS — F329 Major depressive disorder, single episode, unspecified: Secondary | ICD-10-CM | POA: Insufficient documentation

## 2013-01-12 DIAGNOSIS — F3289 Other specified depressive episodes: Secondary | ICD-10-CM | POA: Insufficient documentation

## 2013-01-12 DIAGNOSIS — R51 Headache: Secondary | ICD-10-CM | POA: Insufficient documentation

## 2013-01-12 DIAGNOSIS — Z8742 Personal history of other diseases of the female genital tract: Secondary | ICD-10-CM | POA: Insufficient documentation

## 2013-01-12 DIAGNOSIS — Z8719 Personal history of other diseases of the digestive system: Secondary | ICD-10-CM | POA: Insufficient documentation

## 2013-01-12 DIAGNOSIS — Z87891 Personal history of nicotine dependence: Secondary | ICD-10-CM | POA: Insufficient documentation

## 2013-01-12 DIAGNOSIS — F411 Generalized anxiety disorder: Secondary | ICD-10-CM | POA: Insufficient documentation

## 2013-01-12 DIAGNOSIS — Z79899 Other long term (current) drug therapy: Secondary | ICD-10-CM | POA: Insufficient documentation

## 2013-01-12 DIAGNOSIS — R29810 Facial weakness: Secondary | ICD-10-CM | POA: Insufficient documentation

## 2013-01-12 HISTORY — DX: Bell's palsy: G51.0

## 2013-01-12 LAB — CBC WITH DIFFERENTIAL/PLATELET
Basophils Absolute: 0 10*3/uL (ref 0.0–0.1)
Eosinophils Absolute: 0.4 10*3/uL (ref 0.0–0.7)
Lymphocytes Relative: 36 % (ref 12–46)
Lymphs Abs: 2 10*3/uL (ref 0.7–4.0)
Neutrophils Relative %: 50 % (ref 43–77)
Platelets: 295 10*3/uL (ref 150–400)
RBC: 4.7 MIL/uL (ref 3.87–5.11)
RDW: 12.9 % (ref 11.5–15.5)
WBC: 5.6 10*3/uL (ref 4.0–10.5)

## 2013-01-12 LAB — COMPREHENSIVE METABOLIC PANEL
ALT: 67 U/L — ABNORMAL HIGH (ref 0–35)
AST: 37 U/L (ref 0–37)
Alkaline Phosphatase: 161 U/L — ABNORMAL HIGH (ref 39–117)
CO2: 23 mEq/L (ref 19–32)
GFR calc Af Amer: 82 mL/min — ABNORMAL LOW (ref >90)
Glucose, Bld: 109 mg/dL — ABNORMAL HIGH (ref 70–99)
Potassium: 4.4 mEq/L (ref 3.5–5.1)
Sodium: 141 mEq/L (ref 135–145)
Total Protein: 7.7 g/dL (ref 6.0–8.3)

## 2013-01-12 MED ORDER — METOCLOPRAMIDE HCL 5 MG/ML IJ SOLN
10.0000 mg | Freq: Once | INTRAMUSCULAR | Status: AC
  Administered 2013-01-12: 10 mg via INTRAVENOUS
  Filled 2013-01-12: qty 2

## 2013-01-12 MED ORDER — DIPHENHYDRAMINE HCL 50 MG/ML IJ SOLN
25.0000 mg | Freq: Once | INTRAMUSCULAR | Status: AC
  Administered 2013-01-12: 25 mg via INTRAVENOUS
  Filled 2013-01-12: qty 1

## 2013-01-12 MED ORDER — KETOROLAC TROMETHAMINE 30 MG/ML IJ SOLN
30.0000 mg | Freq: Once | INTRAMUSCULAR | Status: AC
  Administered 2013-01-12: 30 mg via INTRAVENOUS
  Filled 2013-01-12: qty 1

## 2013-01-12 MED ORDER — PREDNISONE 10 MG PO TABS: 10.0000 mg | ORAL_TABLET | Freq: Every day | ORAL | Status: DC

## 2013-01-12 MED ORDER — ACYCLOVIR 400 MG PO TABS: 400.0000 mg | ORAL_TABLET | Freq: Four times a day (QID) | ORAL | Status: DC

## 2013-01-12 NOTE — ED Notes (Signed)
Pt reports for the past couple of days she has had pain behind her right ear. States this morning when she woke up she has some numbness in her face and cheek on that same side. States the area also feels swollen. Reports a hx of migraines. Reports last night she was unable to wink her right eye. States when she was brushing her teeth she was unable to hold water in her mouth on the right side. Reports some tingling in her right arm.

## 2013-01-12 NOTE — ED Notes (Signed)
Pt returned from radiology.

## 2013-01-12 NOTE — Discharge Instructions (Signed)
Return to the ED with any concerns including changes in vision or speech, increased headache, neck pain, fever, weakness in arms or legs, fainting, decreased level of alertness/lethargy, or any other alarming symptoms

## 2013-01-12 NOTE — ED Provider Notes (Signed)
History     CSN: [id number]  Arrival date & time 01/12/13  0944   First MD Initiated Contact with Patient 01/12/13 1026      Chief Complaint  Patient presents with  . Numbness    (Consider location/radiation/quality/duration/timing/severity/associated sxs/prior treatment) HPI Pt presents with weakness of the rigth side of her face.  She noticed this when she awoke this morning.  Felt that her eyelid would not fully close, and when she tried to drink water it dribbled out of the right side.  No weakness of extremities.  Also c/o right sided headache and pain just behind her right ear.  No changes in vision.  No confusion or difficulty with word finding.  No difficulty swallowing.  There are no other associated systemic symptoms, there are no other alleviating or modifying factors.   Past Medical History  Diagnosis Date  . Gall stones   . Abdominal pain   . Ovarian cyst   . Nausea & vomiting   . Wears glasses   . Allergy   . Hyperlipidemia   . Depression   . Anxiety     Past Surgical History  Procedure Laterality Date  . Abdominal hysterectomy    . Laparoscopic endometriosis fulguration    . Cholecystectomy      Family History  Problem Relation Age of Onset  . Cancer Mother     lung  . Heart disease Father   . Diabetes Father   . Heart disease Brother   . Diabetes Brother     History  Substance Use Topics  . Smoking status: Former Games developer  . Smokeless tobacco: Not on file  . Alcohol Use: Yes    OB History   Grav Para Term Preterm Abortions TAB SAB Ect Mult Living                  Review of Systems ROS reviewed and all otherwise negative except for mentioned in HPI  Allergies  Review of patient's allergies indicates no known allergies.  Home Medications   Current Outpatient Rx  Name  Route  Sig  Dispense  Refill  . atorvastatin (LIPITOR) 40 MG tablet   Oral   Take 1 tablet (40 mg total) by mouth daily.   90 tablet   1   . buPROPion (WELLBUTRIN  SR) 150 MG 12 hr tablet   Oral   Take 450 mg by mouth daily.          . Cholecalciferol (VITAMIN D3) 2000 UNITS TABS   Oral   Take 2,000 Units by mouth daily.         Marland Kitchen levothyroxine (SYNTHROID, LEVOTHROID) 50 MCG tablet   Oral   Take 1 tablet (50 mcg total) by mouth daily.   90 tablet   1   . loratadine (CLARITIN) 10 MG tablet   Oral   Take 10 mg by mouth daily.         . magnesium 30 MG tablet   Oral   Take 30 mg by mouth 2 (two) times daily.         . Multiple Vitamin (MULTIVITAMIN WITH MINERALS) TABS   Oral   Take 1 tablet by mouth daily.         Marland Kitchen omeprazole (PRILOSEC) 20 MG capsule   Oral   Take 20 mg by mouth daily.         . Probiotic Product (ALIGN) 4 MG CAPS   Oral   Take 4 mg by mouth  daily.         . sertraline (ZOLOFT) 100 MG tablet   Oral   Take 100 mg by mouth Daily.          Marland Kitchen topiramate (TOPAMAX) 100 MG tablet   Oral   Take 100 mg by mouth 2 (two) times daily.         Marland Kitchen acyclovir (ZOVIRAX) 400 MG tablet   Oral   Take 1 tablet (400 mg total) by mouth 4 (four) times daily.   50 tablet   0   . predniSONE (DELTASONE) 10 MG tablet   Oral   Take 1 tablet (10 mg total) by mouth daily. Take 4 tabs po qD x 3 days, then 3 tabs po qD x 3 days, then 2 tabs po qD x 3 days, then 1 tab po qD x 3 days   30 tablet   0     BP 115/73  Pulse 68  Temp(Src) 98 F (36.7 C) (Oral)  Resp 16  SpO2 100%  LMP 10/20/1998 Vitals reviewed Physical Exam Physical Examination: General appearance - alert, well appearing, and in no distress Mental status - alert, oriented to person, place, and time Eyes - pupils equal and reactive, extraocular eye movements intact Ears- bilateral EACS normal, no lesions, TMS normal bilaterally, no mastoid tenderness or overlying redness Mouth - mucous membranes moist, pharynx normal without lesions Neck - supple, no significant adenopathy, no ttp over neck, 2+ carotid pulses bilaterally, no bruit Chest - clear to  auscultation, no wheezes, rales or rhonchi, symmetric air entry Heart - normal rate, regular rhythm, normal S1, S2, no murmurs, rubs, clicks or gallops Abdomen - soft, nontender, nondistended, no masses or organomegaly Neurological - alert, oriented, unable to close right eyelid fully, facial droop on right, no forehead raise on right side, otherwise cranial nerves intact Extremities - peripheral pulses normal, no pedal edema, no clubbing or cyanosis Skin - normal coloration and turgor, no rashes  ED Course  Procedures (including critical care time)   Date: 01/12/2013  Rate: 71  Rhythm: normal sinus rhythm  QRS Axis: normal  Intervals: normal  ST/T Wave abnormalities: normal  Conduction Disutrbances:none  Narrative Interpretation:   Old EKG Reviewed: none available   Labs Reviewed  CBC WITH DIFFERENTIAL - Abnormal; Notable for the following:    Eosinophils Relative 6 (*)    All other components within normal limits  COMPREHENSIVE METABOLIC PANEL - Abnormal; Notable for the following:    Glucose, Bld 109 (*)    ALT 67 (*)    Alkaline Phosphatase 161 (*)    GFR calc non Af Amer 70 (*)    GFR calc Af Amer 82 (*)    All other components within normal limits   Ct Head Wo Contrast  01/12/2013  *RADIOLOGY REPORT*  Clinical Data: Right face/cheek numbness  CT HEAD WITHOUT CONTRAST  Technique:  Contiguous axial images were obtained from the base of the skull through the vertex without contrast.  Comparison: 06/22/2007  Findings: No evidence of parenchymal hemorrhage or extra-axial fluid collection. No mass lesion, mass effect, or midline shift.  No CT evidence of acute infarction.  Cerebral volume is age appropriate.  No ventriculomegaly.  The visualized paranasal sinuses are essentially clear. The mastoid air cells are unopacified.  No evidence of calvarial fracture.  IMPRESSION: No evidence of acute intracranial abnormality.   Original Report Authenticated By: Charline Bills, M.D.       1. Bell's palsy  MDM  Pt presenting with c/o weakness of right side of face and eyelid not closing since last night.  She has been having pain behind right ear- no signs of mastoiditis, no neck pain, no carotid bruit.  Low suspicion for carotid dissection.  Neuro exam is c/w Bell's Palsy with no forehead raise on right side.  Pt treated for headache and given rx for acyclovir and steroids.  Strict return rpecautions were discussed as well as eye protection.  Given information for follow up with neurology.  Discharged with strict return precautions.  Pt agreeable with plan.       Ethelda Chick, MD 01/13/13 1038

## 2013-01-15 ENCOUNTER — Encounter: Payer: Self-pay | Admitting: *Deleted

## 2013-01-15 ENCOUNTER — Ambulatory Visit (AMBULATORY_SURGERY_CENTER): Payer: 59 | Admitting: *Deleted

## 2013-01-15 VITALS — Ht 67.0 in | Wt 178.6 lb

## 2013-01-15 DIAGNOSIS — Z1211 Encounter for screening for malignant neoplasm of colon: Secondary | ICD-10-CM

## 2013-01-15 MED ORDER — MOVIPREP 100 G PO SOLR: 1.0000 | Freq: Once | ORAL | Status: DC

## 2013-01-15 NOTE — Telephone Encounter (Signed)
Erroneous encounter

## 2013-01-15 NOTE — Progress Notes (Signed)
Denies allergies to eggs or soy products. Denies any complications to sedation or anesthesia.

## 2013-01-16 ENCOUNTER — Encounter: Payer: Self-pay | Admitting: Internal Medicine

## 2013-01-24 ENCOUNTER — Telehealth: Payer: Self-pay | Admitting: Nurse Practitioner

## 2013-01-24 NOTE — Telephone Encounter (Signed)
appt made

## 2013-01-27 ENCOUNTER — Ambulatory Visit (INDEPENDENT_AMBULATORY_CARE_PROVIDER_SITE_OTHER): Payer: 59 | Admitting: Nurse Practitioner

## 2013-01-27 ENCOUNTER — Encounter: Payer: Self-pay | Admitting: Nurse Practitioner

## 2013-01-27 VITALS — BP 136/94 | HR 80 | Temp 97.4°F | Ht 65.5 in | Wt 187.0 lb

## 2013-01-27 DIAGNOSIS — G510 Bell's palsy: Secondary | ICD-10-CM

## 2013-01-27 DIAGNOSIS — G519 Disorder of facial nerve, unspecified: Secondary | ICD-10-CM

## 2013-01-27 MED ORDER — HYDROCODONE-ACETAMINOPHEN 5-325 MG PO TABS: 1.0000 | ORAL_TABLET | Freq: Four times a day (QID) | ORAL | Status: DC | PRN

## 2013-01-27 MED ORDER — GABAPENTIN 100 MG PO CAPS: 100.0000 mg | ORAL_CAPSULE | Freq: Three times a day (TID) | ORAL | Status: DC

## 2013-01-27 NOTE — Patient Instructions (Signed)
Pain Medicine Instructions You have been given a prescription for pain medicines. These medicines may affect your ability to think clearly. They may also affect your ability to perform physical activities. Take these medicines only as needed for pain. You do not need to take them if you are not having pain, unless directed by your caregiver. You can take less than the prescribed dose if you find a smaller amount of medicine controls the pain. It may not be possible to make all of your pain go away, but you should be comfortable enough to move, breathe, and take care of yourself. After you start taking pain medicines, while taking the medicines, and for 8 hours after stopping the medicines:  Do not drive.  Do not operate machinery.  Do not operate power tools.  Do not sign legal documents.  Do not supervise children by yourself.  Do not participate in activities that require climbing or being in high places.  Do not enter a body of water (lake, river, ocean, spa, swimming pool) without an adult nearby who can help you. You may have been prescribed a pain medicine that contains acetaminophen (paracetamol). If so, take only the amount directed by your caregiver. Do not take any other acetaminophen while taking this medicine. An overdose of acetaminophen can result in severe liver damage. If you are taking other medicines, check the active ingredients for acetaminophen. Acetaminophen is found in hundreds of over-the-counter and prescription medicines. These include cold relief products, menstrual cramp relief medicines, fever-reducing medicines, acid indigestion relief products, and pain relief products. HOME CARE INSTRUCTIONS   Do not drink alcohol, take sleeping pills, or take other medicines until at least 8 hours after your last dose of pain medicine, or as directed by your caregiver.  Use a bulk stool softener if you become constipated from your pain medicines. Increasing your intake of fruits  and vegetables will also help.  Write down the times when you take your medicines. Look at the times before taking your next dose of medicine. It is easy to become confused while on pain medicines. Recording the times helps you to avoid an overdose. SEEK MEDICAL CARE IF:  Your medicine is not helping the pain go away.  You vomit or have diarrhea shortly after taking the medicine.  You develop new pain in areas that did not hurt before. SEEK IMMEDIATE MEDICAL CARE IF:  You feel dizzy or faint.  You feel there are other problems that might be caused by your medicine. MAKE SURE YOU:   Understand these instructions.  Will watch your condition.  Will get help right away if you are not doing well or get worse. Document Released: 12/18/2000 Document Revised: 12/04/2011 Document Reviewed: 08/26/2010 [employer] Patient Information 2013 [employer].  

## 2013-01-27 NOTE — Progress Notes (Signed)
  Subjective:    Patient ID: Margaret Robles, female    DOB: 1961-02-09, 52 y.o.   MRN: [id number]  HPI- patient went to ER on Easter Sunday- facial drooping- thought she was having a stroke but was actually Dx with Bells Palsy- Given steroids and acyclovir- Completed course of meds- here for follow up. Patient c/o pain behind right ear and radiates up around ear and up to eye. Steady pain rated 6-7/10 with frequent sharp pains that can " take her to her knees". Heating pad helps some. Aleve doesn't help.    Review of Systems  Neurological: Positive for facial asymmetry and headaches.       Objective:   Physical Exam  Constitutional: She appears well-developed and well-nourished.  Cardiovascular: Normal rate, normal heart sounds and intact distal pulses.   Pulmonary/Chest: Effort normal and breath sounds normal.  Neurological: She has normal reflexes. A cranial nerve deficit is present.  Unable to close right eye lid. Right lip drooping Decrease sensation right cheek and forehead   BP 136/94  Pulse 80  Temp(Src) 97.4 F (36.3 C) (Oral)  Ht 5' 5.5" (1.664 m)  Wt 187 lb (84.823 kg)  BMI 30.63 kg/m2  LMP 10/20/1998        Assessment & Plan:  1. Bell's palsy moist heat  - gabapentin (NEURONTIN) 100 MG capsule; Take 1 capsule (100 mg total) by mouth 3 (three) times daily.  Dispense: 90 capsule; Refill: 3 - HYDROcodone-acetaminophen (NORCO/VICODIN) 5-325 MG per tablet; Take 1 tablet by mouth every 6 (six) hours as needed for pain.  Dispense: 40 tablet; Refill: 0  Mary-Margaret Daphine Deutscher, FNP

## 2013-01-29 ENCOUNTER — Ambulatory Visit (AMBULATORY_SURGERY_CENTER): Payer: 59 | Admitting: Internal Medicine

## 2013-01-29 ENCOUNTER — Encounter: Payer: Self-pay | Admitting: Internal Medicine

## 2013-01-29 VITALS — BP 119/49 | HR 55 | Temp 97.9°F | Resp 19 | Ht 67.0 in | Wt 178.0 lb

## 2013-01-29 DIAGNOSIS — Z1211 Encounter for screening for malignant neoplasm of colon: Secondary | ICD-10-CM

## 2013-01-29 MED ORDER — SODIUM CHLORIDE 0.9 % IV SOLN: 500.0000 mL | INTRAVENOUS | Status: DC

## 2013-01-29 NOTE — Progress Notes (Signed)
Patient did not experience any of the following events: a burn prior to discharge; a fall within the facility; wrong site/side/patient/procedure/implant event; or a hospital transfer or hospital admission upon discharge from the facility. (G8907) Patient did not have preoperative order for IV antibiotic SSI prophylaxis. (G8918)  

## 2013-01-29 NOTE — Patient Instructions (Addendum)
YOU HAD AN ENDOSCOPIC PROCEDURE TODAY AT THE  ENDOSCOPY CENTER: Refer to the procedure report that was given to you for any specific questions about what was found during the examination.  If the procedure report does not answer your questions, please call your gastroenterologist to clarify.  If you requested that your care partner not be given the details of your procedure findings, then the procedure report has been included in a sealed envelope for you to review at your convenience later.  YOU SHOULD EXPECT: Some feelings of bloating in the abdomen. Passage of more gas than usual.  Walking can help get rid of the air that was put into your GI tract during the procedure and reduce the bloating. If you had a lower endoscopy (such as a colonoscopy or flexible sigmoidoscopy) you may notice spotting of blood in your stool or on the toilet paper. If you underwent a bowel prep for your procedure, then you may not have a normal bowel movement for a few days.  DIET: Your first meal following the procedure should be a light meal and then it is ok to progress to your normal diet.  A half-sandwich or bowl of soup is an example of a good first meal.  Heavy or fried foods are harder to digest and may make you feel nauseous or bloated.  Likewise meals heavy in dairy and vegetables can cause extra gas to form and this can also increase the bloating.  Drink plenty of fluids but you should avoid alcoholic beverages for 24 hours.  ACTIVITY: Your care partner should take you home directly after the procedure.  You should plan to take it easy, moving slowly for the rest of the day.  You can resume normal activity the day after the procedure however you should NOT DRIVE or use heavy machinery for 24 hours (because of the sedation medicines used during the test).    SYMPTOMS TO REPORT IMMEDIATELY: A gastroenterologist can be reached at any hour.  During normal business hours, 8:30 AM to 5:00 PM Monday through Friday,  call [phone].  After hours and on weekends, please call the GI answering service at [phone] who will take a message and have the physician on call contact you.   Following lower endoscopy (colonoscopy or flexible sigmoidoscopy):  Excessive amounts of blood in the stool  Significant tenderness or worsening of abdominal pains  Swelling of the abdomen that is new, acute  Fever of 100F or higher   FOLLOW UP: If any biopsies were taken you will be contacted by phone or by letter within the next 1-3 weeks.  Call your gastroenterologist if you have not heard about the biopsies in 3 weeks.  Our staff will call the home number listed on your records the next business day following your procedure to check on you and address any questions or concerns that you may have at that time regarding the information given to you following your procedure. This is a courtesy call and so if there is no answer at the home number and we have not heard from you through the emergency physician on call, we will assume that you have returned to your regular daily activities without incident.  SIGNATURES/CONFIDENTIALITY: You and/or your care partner have signed paperwork which will be entered into your electronic medical record.  These signatures attest to the fact that that the information above on your After Visit Summary has been reviewed and is understood.  Full responsibility of the confidentiality of   this discharge information lies with you and/or your care-partner.   Normal colonoscopy,  Repeat in 10 years-2024    High fiber diet information given.   

## 2013-01-29 NOTE — Op Note (Signed)
[facility] [address] [place], [address]   COLONOSCOPY PROCEDURE REPORT  PATIENT: Margaret Robles, Margaret Robles  MR#: [id number] BIRTHDATE: [birth year] , 51  yrs. old GENDER: Female ENDOSCOPIST: Hart Carwin, MD REFERRED BY:  Rudi Heap, M.D. PROCEDURE DATE:  01/29/2013 PROCEDURE:   Colonoscopy, screening ASA CLASS:   Class I INDICATIONS:Average risk patient for colon cancer. MEDICATIONS: MAC sedation, administered by CRNA and propofol (Diprivan) 250mg  IV  DESCRIPTION OF PROCEDURE:   After the risks and benefits and of the procedure were explained, informed consent was obtained.  A digital rectal exam revealed no abnormalities of the rectum.    The LB [id number]  endoscope was introduced through the anus and advanced to the cecum, which was identified by both the appendix and ileocecal valve .  The quality of the prep was good, using MoviPrep .  The instrument was then slowly withdrawn as the colon was fully examined.     COLON FINDINGS: A normal appearing cecum, ileocecal valve, and appendiceal orifice were identified.  The ascending, hepatic flexure, transverse, splenic flexure, descending, sigmoid colon and rectum appeared unremarkable.  No polyps or cancers were seen. Retroflexed views revealed no abnormalities.     The scope was then withdrawn from the patient and the procedure completed.  COMPLICATIONS: There were no complications. ENDOSCOPIC IMPRESSION: Normal colon  RECOMMENDATIONS: High fiber diet   REPEAT EXAM: In 10 year(s)  for Colonoscopy.  cc:  _______________________________ eSignedHart Carwin, MD 01/29/2013 9:01 AM

## 2013-01-30 ENCOUNTER — Telehealth: Payer: Self-pay

## 2013-01-30 NOTE — Telephone Encounter (Signed)
Left a message on the pt's answering machine to call us back if she has any questions or concerns. Maw

## 2013-02-25 ENCOUNTER — Telehealth: Payer: Self-pay | Admitting: Nurse Practitioner

## 2013-02-25 DIAGNOSIS — E039 Hypothyroidism, unspecified: Secondary | ICD-10-CM

## 2013-02-25 DIAGNOSIS — E785 Hyperlipidemia, unspecified: Secondary | ICD-10-CM

## 2013-02-25 DIAGNOSIS — I10 Essential (primary) hypertension: Secondary | ICD-10-CM

## 2013-02-25 NOTE — Telephone Encounter (Signed)
Orders in computer to have done prior to 03/14/13

## 2013-02-25 NOTE — Telephone Encounter (Signed)
Left detailed message on patient's voice mail.

## 2013-02-27 ENCOUNTER — Other Ambulatory Visit (INDEPENDENT_AMBULATORY_CARE_PROVIDER_SITE_OTHER): Payer: 59

## 2013-02-27 DIAGNOSIS — E039 Hypothyroidism, unspecified: Secondary | ICD-10-CM

## 2013-02-27 DIAGNOSIS — I10 Essential (primary) hypertension: Secondary | ICD-10-CM

## 2013-02-27 DIAGNOSIS — E785 Hyperlipidemia, unspecified: Secondary | ICD-10-CM

## 2013-02-27 LAB — COMPLETE METABOLIC PANEL WITH GFR
ALT: 27 U/L (ref 0–35)
BUN: 15 mg/dL (ref 6–23)
CO2: 23 mEq/L (ref 19–32)
Calcium: 9.2 mg/dL (ref 8.4–10.5)
Chloride: 111 mEq/L (ref 96–112)
Creat: 0.99 mg/dL (ref 0.50–1.10)
GFR, Est African American: 77 mL/min
GFR, Est Non African American: 67 mL/min
Total Bilirubin: 0.4 mg/dL (ref 0.3–1.2)

## 2013-02-27 LAB — THYROID PANEL WITH TSH
T4, Total: 8.5 ug/dL (ref 5.0–12.5)
TSH: 3.736 u[IU]/mL (ref 0.350–4.500)

## 2013-02-27 NOTE — Progress Notes (Signed)
Pt here todady for labs only.

## 2013-02-28 LAB — NMR LIPOPROFILE WITH LIPIDS
HDL Particle Number: 35.4 umol/L (ref >=30.5)
HDL Size: 9.7 nm (ref >=9.2)
HDL-C: 51 mg/dL (ref >=40)
LDL (calc): 56 mg/dL (ref <100)
LDL Particle Number: 877 nmol/L (ref <1000)
LDL Size: 20.8 nm (ref >20.5)
LP-IR Score: 27 (ref <=45)
Small LDL Particle Number: 583 nmol/L — ABNORMAL HIGH (ref <=527)
VLDL Size: 48.7 nm — ABNORMAL HIGH (ref <=46.6)

## 2013-03-04 NOTE — Progress Notes (Signed)
Pt aware of labs  

## 2013-04-21 ENCOUNTER — Other Ambulatory Visit: Payer: Self-pay

## 2013-04-21 MED ORDER — SERTRALINE HCL 100 MG PO TABS: 100.0000 mg | ORAL_TABLET | Freq: Every day | ORAL | Status: DC

## 2013-04-22 ENCOUNTER — Other Ambulatory Visit: Payer: Self-pay

## 2013-04-22 DIAGNOSIS — Z1231 Encounter for screening mammogram for malignant neoplasm of breast: Secondary | ICD-10-CM

## 2013-05-06 ENCOUNTER — Other Ambulatory Visit: Payer: Self-pay

## 2013-05-06 MED ORDER — BUPROPION HCL ER (SR) 150 MG PO TB12: 450.0000 mg | ORAL_TABLET | Freq: Every day | ORAL | Status: DC

## 2013-05-06 MED ORDER — SERTRALINE HCL 100 MG PO TABS: 100.0000 mg | ORAL_TABLET | Freq: Every day | ORAL | Status: DC

## 2013-05-15 ENCOUNTER — Ambulatory Visit (INDEPENDENT_AMBULATORY_CARE_PROVIDER_SITE_OTHER): Payer: 59 | Admitting: Nurse Practitioner

## 2013-05-15 ENCOUNTER — Encounter: Payer: Self-pay | Admitting: Nurse Practitioner

## 2013-05-15 VITALS — BP 123/81 | HR 73 | Temp 97.4°F | Ht 67.0 in | Wt 188.0 lb

## 2013-05-15 DIAGNOSIS — G4700 Insomnia, unspecified: Secondary | ICD-10-CM

## 2013-05-15 DIAGNOSIS — G43909 Migraine, unspecified, not intractable, without status migrainosus: Secondary | ICD-10-CM

## 2013-05-15 MED ORDER — DOXEPIN HCL 3 MG PO TABS: 1.0000 | ORAL_TABLET | Freq: Every evening | ORAL | Status: DC | PRN

## 2013-05-15 MED ORDER — BACLOFEN 10 MG PO TABS: 10.0000 mg | ORAL_TABLET | ORAL | Status: DC | PRN

## 2013-05-15 MED ORDER — SERTRALINE HCL 100 MG PO TABS: 100.0000 mg | ORAL_TABLET | Freq: Every day | ORAL | Status: DC

## 2013-05-15 MED ORDER — TOPIRAMATE 100 MG PO TABS: 100.0000 mg | ORAL_TABLET | Freq: Two times a day (BID) | ORAL | Status: DC

## 2013-05-15 MED ORDER — BUPROPION HCL ER (SR) 150 MG PO TB12: 450.0000 mg | ORAL_TABLET | Freq: Every day | ORAL | Status: DC

## 2013-05-15 NOTE — Progress Notes (Signed)
  Subjective:    Patient ID: Margaret Robles, female    DOB: 1961/02/03, 52 y.o.   MRN: [id number]  HPI 1. Patient in c/o headaches- Goes to DR. Neale Burly and patient doesn't like him- He has her on topamax daily and baclofen prn limited bid 2 x a week- Patient said that when she takes topamax she only needs baclofen 1x per month. Wants me to start taking care of medications. 2. Insomnia- says she thinks the fact  That she is not sleeping is also making her migraines come more often. Has  Tried all OTC sleep aids which do not help.    Review of Systems  All other systems reviewed and are negative.       Objective:   Physical Exam  Constitutional: She appears well-developed and well-nourished.  Cardiovascular: Normal rate and normal heart sounds.   Pulmonary/Chest: Effort normal and breath sounds normal.  Neurological: She is alert. She has normal reflexes. No cranial nerve deficit.    BP 123/81  Pulse 73  Temp(Src) 97.4 F (36.3 C) (Oral)  Ht 5\' 7"  (1.702 m)  Wt 188 lb (85.276 kg)  BMI 29.44 kg/m2  LMP 10/20/1998       Assessment & Plan:  1. Migraines Avoid caffeine - baclofen (LIORESAL) 10 MG tablet; Take 1 tablet (10 mg total) by mouth as needed.  Dispense: 30 each; Refill: 5 - topiramate (TOPAMAX) 100 MG tablet; Take 1 tablet (100 mg total) by mouth 2 (two) times daily.  Dispense: 60 tablet; Refill: 3  2. Insomnia Bedtime ritual - Doxepin HCl 3 MG TABS; Take 1 tablet (3 mg total) by mouth at bedtime and may repeat dose one time if needed.  Dispense: 12 tablet; Refill: 0  Mary-Margaret Daphine Deutscher, FNP

## 2013-05-15 NOTE — Addendum Note (Signed)
Addended by: Bennie Pierini on: 05/15/2013 09:02 AM   Modules accepted: Orders

## 2013-05-15 NOTE — Patient Instructions (Signed)

## 2013-05-19 ENCOUNTER — Ambulatory Visit
Admission: RE | Admit: 2013-05-19 | Discharge: 2013-05-19 | Disposition: A | Discharge status: Home / Self Care | Payer: 59 | Source: Ambulatory Visit

## 2013-05-19 DIAGNOSIS — Z1231 Encounter for screening mammogram for malignant neoplasm of breast: Secondary | ICD-10-CM

## 2013-05-20 ENCOUNTER — Other Ambulatory Visit: Payer: Self-pay | Admitting: *Deleted

## 2013-05-20 MED ORDER — BUPROPION HCL ER (XL) 150 MG PO TB24: ORAL_TABLET | ORAL | Status: DC

## 2013-05-28 ENCOUNTER — Telehealth: Payer: Self-pay | Admitting: Nurse Practitioner

## 2013-05-30 ENCOUNTER — Other Ambulatory Visit: Payer: Self-pay | Admitting: Nurse Practitioner

## 2013-05-30 DIAGNOSIS — G4700 Insomnia, unspecified: Secondary | ICD-10-CM

## 2013-05-30 MED ORDER — DOXEPIN HCL 3 MG PO TABS: ORAL_TABLET | ORAL | Status: DC

## 2013-05-30 NOTE — Telephone Encounter (Signed)
rx sent to pharmacy

## 2013-05-30 NOTE — Telephone Encounter (Signed)
Notified patient that rx sent to pharmacy 

## 2013-06-16 ENCOUNTER — Other Ambulatory Visit: Payer: Self-pay

## 2013-06-16 MED ORDER — LEVOTHYROXINE SODIUM 50 MCG PO TABS: 50.0000 ug | ORAL_TABLET | Freq: Every day | ORAL | Status: DC

## 2013-06-16 MED ORDER — ATORVASTATIN CALCIUM 40 MG PO TABS: 40.0000 mg | ORAL_TABLET | Freq: Every day | ORAL | Status: DC

## 2013-07-31 ENCOUNTER — Other Ambulatory Visit: Payer: Self-pay

## 2013-08-15 ENCOUNTER — Other Ambulatory Visit: Payer: Self-pay | Admitting: *Deleted

## 2013-08-15 DIAGNOSIS — G43909 Migraine, unspecified, not intractable, without status migrainosus: Secondary | ICD-10-CM

## 2013-08-15 MED ORDER — TOPIRAMATE 100 MG PO TABS: 100.0000 mg | ORAL_TABLET | Freq: Two times a day (BID) | ORAL | Status: DC

## 2013-08-26 ENCOUNTER — Ambulatory Visit (HOSPITAL_COMMUNITY)
Admission: RE | Admit: 2013-08-26 | Discharge: 2013-08-26 | Disposition: A | Discharge status: Home / Self Care | Payer: 59 | Source: Ambulatory Visit | Attending: Family Medicine | Admitting: Family Medicine

## 2013-08-26 ENCOUNTER — Encounter: Payer: Self-pay | Admitting: Family Medicine

## 2013-08-26 ENCOUNTER — Encounter: Payer: Self-pay | Admitting: *Deleted

## 2013-08-26 ENCOUNTER — Ambulatory Visit (INDEPENDENT_AMBULATORY_CARE_PROVIDER_SITE_OTHER): Payer: 59 | Admitting: Family Medicine

## 2013-08-26 VITALS — BP 122/80 | HR 90 | Temp 98.6°F | Ht 67.0 in | Wt 185.0 lb

## 2013-08-26 DIAGNOSIS — R220 Localized swelling, mass and lump, head: Secondary | ICD-10-CM

## 2013-08-26 DIAGNOSIS — R4701 Aphasia: Secondary | ICD-10-CM | POA: Insufficient documentation

## 2013-08-26 DIAGNOSIS — R221 Localized swelling, mass and lump, neck: Secondary | ICD-10-CM

## 2013-08-26 MED ORDER — GADOBENATE DIMEGLUMINE 529 MG/ML IV SOLN
15.0000 mL | Freq: Once | INTRAVENOUS | Status: AC | PRN
  Administered 2013-08-26: 15 mL via INTRAVENOUS

## 2013-08-26 MED ORDER — DIAZEPAM 5 MG PO TABS: 5.0000 mg | ORAL_TABLET | Freq: Two times a day (BID) | ORAL | Status: DC | PRN

## 2013-08-26 MED ORDER — IOHEXOL 300 MG/ML  SOLN
100.0000 mL | Freq: Once | INTRAMUSCULAR | Status: AC | PRN
  Administered 2013-08-26: 100 mL via INTRAVENOUS

## 2013-08-26 MED ORDER — IOHEXOL 300 MG/ML  SOLN: 75.0000 mL | Freq: Once | INTRAMUSCULAR | Status: DC | PRN

## 2013-08-26 NOTE — Progress Notes (Signed)
   Subjective:    Patient ID: Margaret Robles, female    DOB: 1961-09-17, 52 y.o.   MRN: [id number]  HPI Pt presents today with chief complaint of right-sided neck mass. Patient states that she initially noticed the mass about one week ago when she was in there with her husband. Patient pulled out the picture from August of this year where she noticed the mass is present as well although she did notice at that time. Mass has been nontender and varied in size being fairly small to fairly large. Patient does put she's had some subjective night sweats over the same time frame although she's not sure if this is menopause. Patient reports she had a hysterectomy several years ago with only one ovary. Patient also reports generalized progressive fatigue as well as uncontrollable weight gain. No bruising. No nausea. Patient does report a positive family history of lung cancer in grandmother and mother. Mother was a nonsmoker and died from lung cancer. The patient has a prior smoking history, quitting 10 years ago. Patient denies any recent infections.    Review of Systems  All other systems reviewed and are negative.       Objective:   Physical Exam  Constitutional: She is oriented to person, place, and time. She appears well-developed and well-nourished.  HENT:  Head: Normocephalic and atraumatic.    Left Ear: External ear normal.  Soft, non tender 4x5 cm palpable mass in R neck No erythema No fluctuance  Eyes: Conjunctivae are normal. Pupils are equal, round, and reactive to light.  Neck: Normal range of motion.  Cardiovascular: Normal rate and regular rhythm.   Pulmonary/Chest: Effort normal and breath sounds normal.  No palpable axillary LAD/mass on R   Abdominal: Soft.  Musculoskeletal: Normal range of motion.  Neurological: She is alert and oriented to person, place, and time.  Skin: Skin is warm.          Assessment & Plan:  Neck mass - Plan: CT Soft Tissue Neck W  Contrast, CT Chest W Contrast, CBC With differential/Platelet, Comprehensive metabolic panel, TSH, Sedimentation rate, HIV Antibody, Viral screen(EBV capsid ab+RSV+CMV ab)  Aphasia - Plan: MR MRA HEAD W WO CONTRAST, Vitamin B12, Vitamin B1  Differential diagnoses for symptoms is extremely broad including infection, inflammatory process, malignancy.  We'll send patient for a CT of the neck and chest with contrast to better assess anatomy. We'll also check baseline labs including a CMP, CBC with peripheral smear. Also check TSH.  Check sedimentation rate, viral serologies including Epstein-Barr and CMV. Check HIV. Given overlapping aphasia we'll check MRI of the brain. No focal neurological deficits on exam today. Anticipate patient needing a fine-needle aspiration of right neck mass. Followup pending blood work and imaging.

## 2013-08-27 ENCOUNTER — Other Ambulatory Visit: Payer: Self-pay | Admitting: *Deleted

## 2013-08-27 DIAGNOSIS — R221 Localized swelling, mass and lump, neck: Secondary | ICD-10-CM

## 2013-08-28 NOTE — Telephone Encounter (Signed)
This encounter was created in error - please disregard.

## 2013-08-29 ENCOUNTER — Ambulatory Visit: Payer: 59 | Admitting: Nurse Practitioner

## 2013-09-01 ENCOUNTER — Other Ambulatory Visit: Payer: Self-pay

## 2013-09-01 DIAGNOSIS — R221 Localized swelling, mass and lump, neck: Secondary | ICD-10-CM

## 2013-09-01 LAB — VITAMIN B1: Vitamin B1 (Thiamine): 19.7 nmol/L (ref 8.1–32.9)

## 2013-09-01 LAB — COMPREHENSIVE METABOLIC PANEL
ALT: 29 IU/L (ref 0–32)
AST: 20 IU/L (ref 0–40)
Albumin/Globulin Ratio: 1.7 (ref 1.1–2.5)
BUN/Creatinine Ratio: 14 (ref 9–23)
CO2: 22 mmol/L (ref 18–29)
Calcium: 9.9 mg/dL (ref 8.7–10.2)
Creatinine, Ser: 0.98 mg/dL (ref 0.57–1.00)
GFR calc Af Amer: 78 mL/min/{1.73_m2} (ref >59)
GFR calc non Af Amer: 67 mL/min/{1.73_m2} (ref >59)
Globulin, Total: 2.6 g/dL (ref 1.5–4.5)
Potassium: 4.9 mmol/L (ref 3.5–5.2)
Sodium: 142 mmol/L (ref 134–144)
Total Bilirubin: 0.4 mg/dL (ref 0.0–1.2)

## 2013-09-01 LAB — CBC WITH DIFFERENTIAL
Basos: 1 %
Eos: 3 %
Eosinophils Absolute: 0.2 10*3/uL (ref 0.0–0.4)
HCT: 44 % (ref 34.0–46.6)
Hemoglobin: 15.5 g/dL (ref 11.1–15.9)
Immature Grans (Abs): 0 10*3/uL (ref 0.0–0.1)
Lymphocytes Absolute: 2.1 10*3/uL (ref 0.7–3.1)
MCH: 32.6 pg (ref 26.6–33.0)
MCHC: 35.2 g/dL (ref 31.5–35.7)
MCV: 93 fL (ref 79–97)
Monocytes Absolute: 0.4 10*3/uL (ref 0.1–0.9)
Neutrophils Absolute: 3.3 10*3/uL (ref 1.4–7.0)
Neutrophils Relative %: 54 %
RBC: 4.75 x10E6/uL (ref 3.77–5.28)

## 2013-09-01 LAB — HEMATOPATH CONSULTATION, SMEAR: Path Rev PLTs: NORMAL

## 2013-09-01 LAB — EPSTEIN-BARR VIRUS VCA ANTIBODY PANEL
EBV AB VCA, IGM: <36 U/mL (ref 0.0–35.9)
EBV Nuclear Antigen Ab, IgG: 581 U/mL — ABNORMAL HIGH (ref 0.0–17.9)

## 2013-09-01 LAB — HIV ANTIBODY (ROUTINE TESTING W REFLEX): HIV 1/O/2 Abs-Index Value: <1 (ref <1.00)

## 2013-09-01 LAB — SEDIMENTATION RATE: Sed Rate: 2 mm/hr (ref 0–40)

## 2013-09-01 LAB — VITAMIN B12: Vitamin B-12: 458 pg/mL (ref 211–946)

## 2013-09-02 ENCOUNTER — Telehealth: Payer: Self-pay | Admitting: *Deleted

## 2013-09-02 DIAGNOSIS — E039 Hypothyroidism, unspecified: Secondary | ICD-10-CM

## 2013-09-02 MED ORDER — LEVOTHYROXINE SODIUM 100 MCG PO TABS: 100.0000 ug | ORAL_TABLET | Freq: Every day | ORAL | Status: DC

## 2013-09-02 NOTE — Telephone Encounter (Signed)
Discussed labs and increased synthroid dose. Will need to rck TSH in 6-8 weeks. Order placed. Patient aware.

## 2013-09-08 ENCOUNTER — Other Ambulatory Visit: Payer: Self-pay

## 2013-09-08 NOTE — Telephone Encounter (Signed)
Last seen 08/26/13  Dr Alvester Morin  This med was not on EPIC list  If approved route to  Nurse to [facility]

## 2013-09-09 ENCOUNTER — Ambulatory Visit (HOSPITAL_COMMUNITY)
Admission: RE | Admit: 2013-09-09 | Discharge: 2013-09-09 | Disposition: A | Discharge status: Home / Self Care | Payer: 59 | Source: Ambulatory Visit | Attending: Family Medicine | Admitting: Family Medicine

## 2013-09-09 ENCOUNTER — Other Ambulatory Visit: Payer: Self-pay | Admitting: Family Medicine

## 2013-09-09 DIAGNOSIS — R220 Localized swelling, mass and lump, head: Secondary | ICD-10-CM | POA: Insufficient documentation

## 2013-09-09 DIAGNOSIS — R221 Localized swelling, mass and lump, neck: Secondary | ICD-10-CM

## 2013-09-09 MED ORDER — DOXEPIN HCL 3 MG PO TABS: 1.0000 | ORAL_TABLET | Freq: Every day | ORAL | Status: DC

## 2013-09-09 NOTE — Telephone Encounter (Signed)
Will need follow up with [facility] for any additional refills.

## 2013-09-12 ENCOUNTER — Other Ambulatory Visit: Payer: Self-pay | Admitting: *Deleted

## 2013-09-12 MED ORDER — ATORVASTATIN CALCIUM 40 MG PO TABS: 40.0000 mg | ORAL_TABLET | Freq: Every day | ORAL | Status: DC

## 2013-09-26 ENCOUNTER — Other Ambulatory Visit: Payer: Self-pay

## 2013-09-26 MED ORDER — BUPROPION HCL ER (XL) 150 MG PO TB24
ORAL_TABLET | ORAL | Status: DC
Start: 2013-09-26 — End: 2014-04-17

## 2013-10-14 ENCOUNTER — Other Ambulatory Visit: Payer: Self-pay

## 2013-10-14 MED ORDER — ATORVASTATIN CALCIUM 40 MG PO TABS
40.0000 mg | ORAL_TABLET | Freq: Every day | ORAL | Status: DC
Start: 2013-10-14 — End: 2013-11-20

## 2013-10-14 MED ORDER — SERTRALINE HCL 100 MG PO TABS: 100.0000 mg | ORAL_TABLET | Freq: Every day | ORAL | Status: DC

## 2013-10-14 NOTE — Telephone Encounter (Signed)
Last seen 08/26/13  Dr Alvester MorinNewton  Last lipids 02/27/13

## 2013-11-05 ENCOUNTER — Other Ambulatory Visit: Payer: Self-pay | Admitting: *Deleted

## 2013-11-05 MED ORDER — DOXEPIN HCL 3 MG PO TABS
1.0000 | ORAL_TABLET | Freq: Every day | ORAL | Status: DC
Start: 2013-11-05 — End: 2014-04-17

## 2013-11-20 ENCOUNTER — Other Ambulatory Visit: Payer: Self-pay | Admitting: *Deleted

## 2013-11-20 DIAGNOSIS — G43909 Migraine, unspecified, not intractable, without status migrainosus: Secondary | ICD-10-CM

## 2013-11-20 NOTE — Telephone Encounter (Signed)
Patient is also requesting silenor 3mg  but i do not see on medication list

## 2013-11-24 MED ORDER — LEVOTHYROXINE SODIUM 100 MCG PO TABS: 100.0000 ug | ORAL_TABLET | Freq: Every day | ORAL | Status: DC

## 2013-11-24 MED ORDER — TOPIRAMATE 100 MG PO TABS: 100.0000 mg | ORAL_TABLET | Freq: Two times a day (BID) | ORAL | Status: DC

## 2013-11-24 MED ORDER — ATORVASTATIN CALCIUM 40 MG PO TABS: 40.0000 mg | ORAL_TABLET | Freq: Every day | ORAL | Status: DC

## 2013-11-24 NOTE — Telephone Encounter (Signed)
Will need appt for silenor.  Other Rxs approved.

## 2013-11-25 NOTE — Telephone Encounter (Signed)
PT AWARE AND WILL CALL TO SCHEDULE

## 2013-12-17 ENCOUNTER — Telehealth: Payer: Self-pay | Admitting: Nurse Practitioner

## 2013-12-17 ENCOUNTER — Ambulatory Visit: Payer: 59 | Admitting: General Practice

## 2013-12-17 ENCOUNTER — Other Ambulatory Visit: Payer: Self-pay | Admitting: Family Medicine

## 2013-12-17 NOTE — Telephone Encounter (Signed)
APPT MADE

## 2014-01-14 ENCOUNTER — Other Ambulatory Visit: Payer: Self-pay | Admitting: Nurse Practitioner

## 2014-01-15 NOTE — Telephone Encounter (Signed)
Last seen 08/26/13 Dr Alvester MorinNewton  Last lipid 02/27/13

## 2014-01-15 NOTE — Telephone Encounter (Signed)
This prescription may be refilled x1, patient needs an appointment to be seen and to get a lipid liver panel

## 2014-02-05 ENCOUNTER — Other Ambulatory Visit: Payer: Self-pay | Admitting: Nurse Practitioner

## 2014-02-13 ENCOUNTER — Other Ambulatory Visit: Payer: Self-pay | Admitting: Family Medicine

## 2014-02-17 NOTE — Telephone Encounter (Signed)
Last seen 08/26/13 Dr Alvester Morin Last lipid 02/27/13

## 2014-02-17 NOTE — Telephone Encounter (Signed)
Last seen 08/26/13  Dr Alvester Morin

## 2014-04-17 ENCOUNTER — Encounter: Payer: Self-pay | Admitting: Physician Assistant

## 2014-04-17 ENCOUNTER — Ambulatory Visit (INDEPENDENT_AMBULATORY_CARE_PROVIDER_SITE_OTHER): Payer: BC Managed Care – PPO | Admitting: Physician Assistant

## 2014-04-17 VITALS — BP 120/86 | HR 69 | Temp 98.3°F | Resp 16 | Ht 67.0 in | Wt 173.5 lb

## 2014-04-17 DIAGNOSIS — L03115 Cellulitis of right lower limb: Secondary | ICD-10-CM

## 2014-04-17 DIAGNOSIS — F341 Dysthymic disorder: Secondary | ICD-10-CM

## 2014-04-17 DIAGNOSIS — G43809 Other migraine, not intractable, without status migrainosus: Secondary | ICD-10-CM

## 2014-04-17 DIAGNOSIS — R002 Palpitations: Secondary | ICD-10-CM | POA: Insufficient documentation

## 2014-04-17 DIAGNOSIS — E785 Hyperlipidemia, unspecified: Secondary | ICD-10-CM

## 2014-04-17 DIAGNOSIS — F32A Depression, unspecified: Secondary | ICD-10-CM

## 2014-04-17 DIAGNOSIS — K219 Gastro-esophageal reflux disease without esophagitis: Secondary | ICD-10-CM

## 2014-04-17 DIAGNOSIS — L03119 Cellulitis of unspecified part of limb: Secondary | ICD-10-CM

## 2014-04-17 DIAGNOSIS — L02419 Cutaneous abscess of limb, unspecified: Secondary | ICD-10-CM

## 2014-04-17 DIAGNOSIS — Z0000 Encounter for general adult medical examination without abnormal findings: Secondary | ICD-10-CM | POA: Insufficient documentation

## 2014-04-17 DIAGNOSIS — F329 Major depressive disorder, single episode, unspecified: Secondary | ICD-10-CM

## 2014-04-17 DIAGNOSIS — F3289 Other specified depressive episodes: Secondary | ICD-10-CM

## 2014-04-17 DIAGNOSIS — F419 Anxiety disorder, unspecified: Secondary | ICD-10-CM

## 2014-04-17 LAB — COMPREHENSIVE METABOLIC PANEL
ALT: 27 U/L (ref 0–35)
AST: 30 U/L (ref 0–37)
Albumin: 4.2 g/dL (ref 3.5–5.2)
Alkaline Phosphatase: 88 U/L (ref 39–117)
BUN: 11 mg/dL (ref 6–23)
CALCIUM: 9.6 mg/dL (ref 8.4–10.5)
CHLORIDE: 105 meq/L (ref 96–112)
CO2: 28 mEq/L (ref 19–32)
CREATININE: 0.77 mg/dL (ref 0.50–1.10)
GLUCOSE: 93 mg/dL (ref 70–99)
Potassium: 4.7 mEq/L (ref 3.5–5.3)
Sodium: 141 mEq/L (ref 135–145)
Total Bilirubin: 0.5 mg/dL (ref 0.2–1.2)
Total Protein: 7.6 g/dL (ref 6.0–8.3)

## 2014-04-17 LAB — CBC
HEMATOCRIT: 43.4 % (ref 36.0–46.0)
Hemoglobin: 15 g/dL (ref 12.0–15.0)
MCH: 32.2 pg (ref 26.0–34.0)
MCHC: 34.6 g/dL (ref 30.0–36.0)
MCV: 93.1 fL (ref 78.0–100.0)
Platelets: 390 10*3/uL (ref 150–400)
RBC: 4.66 MIL/uL (ref 3.87–5.11)
RDW: 13.2 % (ref 11.5–15.5)
WBC: 5.7 10*3/uL (ref 4.0–10.5)

## 2014-04-17 LAB — TSH: TSH: 2.496 u[IU]/mL (ref 0.350–4.500)

## 2014-04-17 LAB — LIPID PANEL
CHOLESTEROL: 193 mg/dL (ref 0–200)
HDL: 68 mg/dL (ref >39)
LDL CALC: 101 mg/dL — AB (ref 0–99)
Total CHOL/HDL Ratio: 2.8 Ratio
Triglycerides: 122 mg/dL (ref <150)
VLDL: 24 mg/dL (ref 0–40)

## 2014-04-17 LAB — T4: T4, Total: 9.5 ug/dL (ref 5.0–12.5)

## 2014-04-17 MED ORDER — SULFAMETHOXAZOLE-TRIMETHOPRIM 800-160 MG PO TABS: 1.0000 | ORAL_TABLET | Freq: Two times a day (BID) | ORAL | Status: DC

## 2014-04-17 MED ORDER — SERTRALINE HCL 100 MG PO TABS: ORAL_TABLET | ORAL | Status: DC

## 2014-04-17 MED ORDER — BACLOFEN 10 MG PO TABS: 10.0000 mg | ORAL_TABLET | ORAL | Status: DC | PRN

## 2014-04-17 MED ORDER — OMEPRAZOLE 20 MG PO CPDR: 20.0000 mg | DELAYED_RELEASE_CAPSULE | Freq: Every day | ORAL | Status: DC

## 2014-04-17 MED ORDER — BUPROPION HCL ER (XL) 150 MG PO TB24: ORAL_TABLET | ORAL | Status: DC

## 2014-04-17 MED ORDER — TOPIRAMATE 100 MG PO TABS: ORAL_TABLET | ORAL | Status: DC

## 2014-04-17 NOTE — Assessment & Plan Note (Signed)
Unclear etiology.  Will obtain TFTs.  EKG obtained and reveals NSR.  Referral to Cardiology placed for evaluation and EST. Order placed for Holter monitor placement.

## 2014-04-17 NOTE — Progress Notes (Signed)
Pre visit review using our clinic review tool, if applicable. No additional management support is needed unless otherwise documented below in the visit note/SLS  

## 2014-04-17 NOTE — Assessment & Plan Note (Signed)
Will obtain fasting lipid panel.  Continue aerobic exercise and dietary changes.

## 2014-04-17 NOTE — Assessment & Plan Note (Signed)
Continue current regimen

## 2014-04-17 NOTE — Assessment & Plan Note (Signed)
Medical history reviewed.  Patient UTD on immunizations and colonoscopy. Due for mammogram in August.  [facility]  Will obtain fasting labs at today's visit.

## 2014-04-17 NOTE — Assessment & Plan Note (Signed)
Medications refilled.  Resume Zoloft.  For Wellbutrin, please start with 1 tablet daily x 3 days, then 2 tablets daily x 3 days, before resuming 3 tablets (450 mg) daily.

## 2014-04-17 NOTE — Patient Instructions (Signed)
Please restart medications -- with the Wellbutrin restart by taking 1 tablet per day for 3 days, then 2 tablets for 3 days, then resume your usual 3 tablets per day.    Please obtain labs.  I will call you with your results.  You will be contacted by Cardiology for an appointment and for Holter Monitor placement.  For your leg -- STOP doxycycline.  Please begin taking Bactrim as directed.  Claritin will help with inflammation and itch.  Call or return if symptoms are not improving with new medication.  Preventive Care for Adults A healthy lifestyle and preventive care can promote health and wellness. Preventive health guidelines for women include the following key practices.  A routine yearly physical is a good way to check with your health care provider about your health and preventive screening. It is a chance to share any concerns and updates on your health and to receive a thorough exam.  Visit your dentist for a routine exam and preventive care every 6 months. Brush your teeth twice a day and floss once a day. Good oral hygiene prevents tooth decay and gum disease.  The frequency of eye exams is based on your age, health, family medical history, use of contact lenses, and other factors. Follow your health care provider's recommendations for frequency of eye exams.  Eat a healthy diet. Foods like vegetables, fruits, whole grains, low-fat dairy products, and lean protein foods contain the nutrients you need without too many calories. Decrease your intake of foods high in solid fats, added sugars, and salt. Eat the right amount of calories for you.Get information about a proper diet from your health care provider, if necessary.  Regular physical exercise is one of the most important things you can do for your health. Most adults should get at least 150 minutes of moderate-intensity exercise (any activity that increases your heart rate and causes you to sweat) each week. In addition, most adults  need muscle-strengthening exercises on 2 or more days a week.  Maintain a healthy weight. The body mass index (BMI) is a screening tool to identify possible weight problems. It provides an estimate of body fat based on height and weight. Your health care provider can find your BMI and can help you achieve or maintain a healthy weight.For adults 20 years and older:  A BMI below 18.5 is considered underweight.  A BMI of 18.5 to 24.9 is normal.  A BMI of 25 to 29.9 is considered overweight.  A BMI of 30 and above is considered obese.  Maintain normal blood lipids and cholesterol levels by exercising and minimizing your intake of saturated fat. Eat a balanced diet with plenty of fruit and vegetables. Blood tests for lipids and cholesterol should begin at age 38 and be repeated every 5 years. If your lipid or cholesterol levels are high, you are over 50, or you are at high risk for heart disease, you may need your cholesterol levels checked more frequently.Ongoing high lipid and cholesterol levels should be treated with medicines if diet and exercise are not working.  If you smoke, find out from your health care provider how to quit. If you do not use tobacco, do not start.  Lung cancer screening is recommended for adults aged 12-80 years who are at high risk for developing lung cancer because of a history of smoking. A yearly low-dose CT scan of the lungs is recommended for people who have at least a 30-pack-year history of smoking and are a  current smoker or have quit within the past 15 years. A pack year of smoking is smoking an average of 1 pack of cigarettes a day for 1 year (for example: 1 pack a day for 30 years or 2 packs a day for 15 years). Yearly screening should continue until the smoker has stopped smoking for at least 15 years. Yearly screening should be stopped for people who develop a health problem that would prevent them from having lung cancer treatment.  If you are pregnant, do not  drink alcohol. If you are breastfeeding, be very cautious about drinking alcohol. If you are not pregnant and choose to drink alcohol, do not have more than 1 drink per day. One drink is considered to be 12 ounces (355 mL) of beer, 5 ounces (148 mL) of wine, or 1.5 ounces (44 mL) of liquor.  Avoid use of street drugs. Do not share needles with anyone. Ask for help if you need support or instructions about stopping the use of drugs.  High blood pressure causes heart disease and increases the risk of stroke. Your blood pressure should be checked at least every 1 to 2 years. Ongoing high blood pressure should be treated with medicines if weight loss and exercise do not work.  If you are 55-14 years old, ask your health care provider if you should take aspirin to prevent strokes.  Diabetes screening involves taking a blood sample to check your fasting blood sugar level. This should be done once every 3 years, after age 83, if you are within normal weight and without risk factors for diabetes. Testing should be considered at a younger age or be carried out more frequently if you are overweight and have at least 1 risk factor for diabetes.  Breast cancer screening is essential preventive care for women. You should practice "breast self-awareness." This means understanding the normal appearance and feel of your breasts and may include breast self-examination. Any changes detected, no matter how small, should be reported to a health care provider. Women in their 87s and 30s should have a clinical breast exam (CBE) by a health care provider as part of a regular health exam every 1 to 3 years. After age 53, women should have a CBE every year. Starting at age 73, women should consider having a mammogram (breast X-ray test) every year. Women who have a family history of breast cancer should talk to their health care provider about genetic screening. Women at a high risk of breast cancer should talk to their health  care providers about having an MRI and a mammogram every year.  Breast cancer gene (BRCA)-related cancer risk assessment is recommended for women who have family members with BRCA-related cancers. BRCA-related cancers include breast, ovarian, tubal, and peritoneal cancers. Having family members with these cancers may be associated with an increased risk for harmful changes (mutations) in the breast cancer genes BRCA1 and BRCA2. Results of the assessment will determine the need for genetic counseling and BRCA1 and BRCA2 testing.  Routine pelvic exams to screen for cancer are no longer recommended for nonpregnant women who are considered low risk for cancer of the pelvic organs (ovaries, uterus, and vagina) and who do not have symptoms. Ask your health care provider if a screening pelvic exam is right for you.  If you have had past treatment for cervical cancer or a condition that could lead to cancer, you need Pap tests and screening for cancer for at least 20 years after your treatment. If  Pap tests have been discontinued, your risk factors (such as having a new sexual partner) need to be reassessed to determine if screening should be resumed. Some women have medical problems that increase the chance of getting cervical cancer. In these cases, your health care provider may recommend more frequent screening and Pap tests.  The HPV test is an additional test that may be used for cervical cancer screening. The HPV test looks for the virus that can cause the cell changes on the cervix. The cells collected during the Pap test can be tested for HPV. The HPV test could be used to screen women aged 74 years and older, and should be used in women of any age who have unclear Pap test results. After the age of 40, women should have HPV testing at the same frequency as a Pap test.  Colorectal cancer can be detected and often prevented. Most routine colorectal cancer screening begins at the age of 42 years and  continues through age 49 years. However, your health care provider may recommend screening at an earlier age if you have risk factors for colon cancer. On a yearly basis, your health care provider may provide home test kits to check for hidden blood in the stool. Use of a small camera at the end of a tube, to directly examine the colon (sigmoidoscopy or colonoscopy), can detect the earliest forms of colorectal cancer. Talk to your health care provider about this at age 70, when routine screening begins. Direct exam of the colon should be repeated every 5-10 years through age 11 years, unless early forms of pre-cancerous polyps or small growths are found.  People who are at an increased risk for hepatitis B should be screened for this virus. You are considered at high risk for hepatitis B if:  You were born in a country where hepatitis B occurs often. Talk with your health care provider about which countries are considered high risk.  Your parents were born in a high-risk country and you have not received a shot to protect against hepatitis B (hepatitis B vaccine).  You have HIV or AIDS.  You use needles to inject street drugs.  You live with, or have sex with, someone who has hepatitis B.  You get hemodialysis treatment.  You take certain medicines for conditions like cancer, organ transplantation, and autoimmune conditions.  Hepatitis C blood testing is recommended for all people born from [birth year] through 1964 and any individual with known risks for hepatitis C.  Practice safe sex. Use condoms and avoid high-risk sexual practices to reduce the spread of sexually transmitted infections (STIs). STIs include gonorrhea, chlamydia, syphilis, trichomonas, herpes, HPV, and human immunodeficiency virus (HIV). Herpes, HIV, and HPV are viral illnesses that have no cure. They can result in disability, cancer, and death.  You should be screened for sexually transmitted illnesses (STIs) including gonorrhea  and chlamydia if:  You are sexually active and are younger than 24 years.  You are older than 24 years and your health care provider tells you that you are at risk for this type of infection.  Your sexual activity has changed since you were last screened and you are at an increased risk for chlamydia or gonorrhea. Ask your health care provider if you are at risk.  If you are at risk of being infected with HIV, it is recommended that you take a prescription medicine daily to prevent HIV infection. This is called preexposure prophylaxis (PrEP). You are considered at risk if:  You are a heterosexual woman, are sexually active, and are at increased risk for HIV infection.  You take drugs by injection.  You are sexually active with a partner who has HIV.  Talk with your health care provider about whether you are at high risk of being infected with HIV. If you choose to begin PrEP, you should first be tested for HIV. You should then be tested every 3 months for as long as you are taking PrEP.  Osteoporosis is a disease in which the bones lose minerals and strength with aging. This can result in serious bone fractures or breaks. The risk of osteoporosis can be identified using a bone density scan. Women ages 52 years and over and women at risk for fractures or osteoporosis should discuss screening with their health care providers. Ask your health care provider whether you should take a calcium supplement or vitamin D to reduce the rate of osteoporosis.  Menopause can be associated with physical symptoms and risks. Hormone replacement therapy is available to decrease symptoms and risks. You should talk to your health care provider about whether hormone replacement therapy is right for you.  Use sunscreen. Apply sunscreen liberally and repeatedly throughout the day. You should seek shade when your shadow is shorter than you. Protect yourself by wearing long sleeves, pants, a wide-brimmed hat, and  sunglasses year round, whenever you are outdoors.  Once a month, do a whole body skin exam, using a mirror to look at the skin on your back. Tell your health care provider of new moles, moles that have irregular borders, moles that are larger than a pencil eraser, or moles that have changed in shape or color.  Stay current with required vaccines (immunizations).  Influenza vaccine. All adults should be immunized every year.  Tetanus, diphtheria, and acellular pertussis (Td, Tdap) vaccine. Pregnant women should receive 1 dose of Tdap vaccine during each pregnancy. The dose should be obtained regardless of the length of time since the last dose. Immunization is preferred during the 27th-36th week of gestation. An adult who has not previously received Tdap or who does not know her vaccine status should receive 1 dose of Tdap. This initial dose should be followed by tetanus and diphtheria toxoids (Td) booster doses every 10 years. Adults with an unknown or incomplete history of completing a 3-dose immunization series with Td-containing vaccines should begin or complete a primary immunization series including a Tdap dose. Adults should receive a Td booster every 10 years.  Varicella vaccine. An adult without evidence of immunity to varicella should receive 2 doses or a second dose if she has previously received 1 dose. Pregnant females who do not have evidence of immunity should receive the first dose after pregnancy. This first dose should be obtained before leaving the health care facility. The second dose should be obtained 4-8 weeks after the first dose.  Human papillomavirus (HPV) vaccine. Females aged 13-26 years who have not received the vaccine previously should obtain the 3-dose series. The vaccine is not recommended for use in pregnant females. However, pregnancy testing is not needed before receiving a dose. If a female is found to be pregnant after receiving a dose, no treatment is needed. In that  case, the remaining doses should be delayed until after the pregnancy. Immunization is recommended for any person with an immunocompromised condition through the age of 68 years if she did not get any or all doses earlier. During the 3-dose series, the second dose should be obtained  4-8 weeks after the first dose. The third dose should be obtained 24 weeks after the first dose and 16 weeks after the second dose.  Zoster vaccine. One dose is recommended for adults aged 86 years or older unless certain conditions are present.  Measles, mumps, and rubella (MMR) vaccine. Adults born before [birth year] generally are considered immune to measles and mumps. Adults born in [birth year] or later should have 1 or more doses of MMR vaccine unless there is a contraindication to the vaccine or there is laboratory evidence of immunity to each of the three diseases. A routine second dose of MMR vaccine should be obtained at least 28 days after the first dose for students attending postsecondary schools, health care workers, or international travelers. People who received inactivated measles vaccine or an unknown type of measles vaccine during 1963-1967 should receive 2 doses of MMR vaccine. People who received inactivated mumps vaccine or an unknown type of mumps vaccine before 1979 and are at high risk for mumps infection should consider immunization with 2 doses of MMR vaccine. For females of childbearing age, rubella immunity should be determined. If there is no evidence of immunity, females who are not pregnant should be vaccinated. If there is no evidence of immunity, females who are pregnant should delay immunization until after pregnancy. Unvaccinated health care workers born before [birth year] who lack laboratory evidence of measles, mumps, or rubella immunity or laboratory confirmation of disease should consider measles and mumps immunization with 2 doses of MMR vaccine or rubella immunization with 1 dose of MMR vaccine.  Pneumococcal  13-valent conjugate (PCV13) vaccine. When indicated, a person who is uncertain of her immunization history and has no record of immunization should receive the PCV13 vaccine. An adult aged 38 years or older who has certain medical conditions and has not been previously immunized should receive 1 dose of PCV13 vaccine. This PCV13 should be followed with a dose of pneumococcal polysaccharide (PPSV23) vaccine. The PPSV23 vaccine dose should be obtained at least 8 weeks after the dose of PCV13 vaccine. An adult aged 48 years or older who has certain medical conditions and previously received 1 or more doses of PPSV23 vaccine should receive 1 dose of PCV13. The PCV13 vaccine dose should be obtained 1 or more years after the last PPSV23 vaccine dose.  Pneumococcal polysaccharide (PPSV23) vaccine. When PCV13 is also indicated, PCV13 should be obtained first. All adults aged 54 years and older should be immunized. An adult younger than age 47 years who has certain medical conditions should be immunized. Any person who resides in a nursing home or long-term care facility should be immunized. An adult smoker should be immunized. People with an immunocompromised condition and certain other conditions should receive both PCV13 and PPSV23 vaccines. People with human immunodeficiency virus (HIV) infection should be immunized as soon as possible after diagnosis. Immunization during chemotherapy or radiation therapy should be avoided. Routine use of PPSV23 vaccine is not recommended for American Indians, [place]atives, or people younger than 65 years unless there are medical conditions that require PPSV23 vaccine. When indicated, people who have unknown immunization and have no record of immunization should receive PPSV23 vaccine. One-time revaccination 5 years after the first dose of PPSV23 is recommended for people aged 19-64 years who have chronic kidney failure, nephrotic syndrome, asplenia, or immunocompromised conditions.  People who received 1-2 doses of PPSV23 before age 4 years should receive another dose of PPSV23 vaccine at age 36 years or later if at least 4  years have passed since the previous dose. Doses of PPSV23 are not needed for people immunized with PPSV23 at or after age 22 years.  Meningococcal vaccine. Adults with asplenia or persistent complement component deficiencies should receive 2 doses of quadrivalent meningococcal conjugate (MenACWY-D) vaccine. The doses should be obtained at least 2 months apart. Microbiologists working with certain meningococcal bacteria, [employer] recruits, people at risk during an outbreak, and people who travel to or live in countries with a high rate of meningitis should be immunized. A first-year college student up through age 78 years who is living in a residence hall should receive a dose if she did not receive a dose on or after her 16th birthday. Adults who have certain high-risk conditions should receive one or more doses of vaccine.  Hepatitis A vaccine. Adults who wish to be protected from this disease, have certain high-risk conditions, work with hepatitis A-infected animals, work in hepatitis A research labs, or travel to or work in countries with a high rate of hepatitis A should be immunized. Adults who were previously unvaccinated and who anticipate close contact with an international adoptee during the first 60 days after arrival in the [place] from a country with a high rate of hepatitis A should be immunized.  Hepatitis B vaccine. Adults who wish to be protected from this disease, have certain high-risk conditions, may be exposed to blood or other infectious body fluids, are household contacts or sex partners of hepatitis B positive people, are clients or workers in certain care facilities, or travel to or work in countries with a high rate of hepatitis B should be immunized.  Haemophilus influenzae type b (Hib) vaccine. A previously unvaccinated person with  asplenia or sickle cell disease or having a scheduled splenectomy should receive 1 dose of Hib vaccine. Regardless of previous immunization, a recipient of a hematopoietic stem cell transplant should receive a 3-dose series 6-12 months after her successful transplant. Hib vaccine is not recommended for adults with HIV infection. Preventive Services / Frequency Ages 41 to 96 years  Blood pressure check.** / Every 1 to 2 years.  Lipid and cholesterol check.** / Every 5 years beginning at age 25.  Clinical breast exam.** / Every 3 years for women in their 32s and 107s.  BRCA-related cancer risk assessment.** / For women who have family members with a BRCA-related cancer (breast, ovarian, tubal, or peritoneal cancers).  Pap test.** / Every 2 years from ages 73 through 79. Every 3 years starting at age 41 through age 22 or 11 with a history of 3 consecutive normal Pap tests.  HPV screening.** / Every 3 years from ages 38 through ages 30 to 56 with a history of 3 consecutive normal Pap tests.  Hepatitis C blood test.** / For any individual with known risks for hepatitis C.  Skin self-exam. / Monthly.  Influenza vaccine. / Every year.  Tetanus, diphtheria, and acellular pertussis (Tdap, Td) vaccine.** / Consult your health care provider. Pregnant women should receive 1 dose of Tdap vaccine during each pregnancy. 1 dose of Td every 10 years.  Varicella vaccine.** / Consult your health care provider. Pregnant females who do not have evidence of immunity should receive the first dose after pregnancy.  HPV vaccine. / 3 doses over 6 months, if 96 and younger. The vaccine is not recommended for use in pregnant females. However, pregnancy testing is not needed before receiving a dose.  Measles, mumps, rubella (MMR) vaccine.** / You need at least 1 dose  of MMR if you were born in 1957 or later. You may also need a 2nd dose. For females of childbearing age, rubella immunity should be determined. If there  is no evidence of immunity, females who are not pregnant should be vaccinated. If there is no evidence of immunity, females who are pregnant should delay immunization until after pregnancy.  Pneumococcal 13-valent conjugate (PCV13) vaccine.** / Consult your health care provider.  Pneumococcal polysaccharide (PPSV23) vaccine.** / 1 to 2 doses if you smoke cigarettes or if you have certain conditions.  Meningococcal vaccine.** / 1 dose if you are age 21 to 4 years and a Market researcher living in a residence hall, or have one of several medical conditions, you need to get vaccinated against meningococcal disease. You may also need additional booster doses.  Hepatitis A vaccine.** / Consult your health care provider.  Hepatitis B vaccine.** / Consult your health care provider.  Haemophilus influenzae type b (Hib) vaccine.** / Consult your health care provider. Ages 6 to 72 years  Blood pressure check.** / Every 1 to 2 years.  Lipid and cholesterol check.** / Every 5 years beginning at age 50 years.  Lung cancer screening. / Every year if you are aged 15-80 years and have a 30-pack-year history of smoking and currently smoke or have quit within the past 15 years. Yearly screening is stopped once you have quit smoking for at least 15 years or develop a health problem that would prevent you from having lung cancer treatment.  Clinical breast exam.** / Every year after age 44 years.  BRCA-related cancer risk assessment.** / For women who have family members with a BRCA-related cancer (breast, ovarian, tubal, or peritoneal cancers).  Mammogram.** / Every year beginning at age 56 years and continuing for as long as you are in good health. Consult with your health care provider.  Pap test.** / Every 3 years starting at age 29 years through age 37 or 40 years with a history of 3 consecutive normal Pap tests.  HPV screening.** / Every 3 years from ages 22 years through ages 59 to 58  years with a history of 3 consecutive normal Pap tests.  Fecal occult blood test (FOBT) of stool. / Every year beginning at age 34 years and continuing until age 35 years. You may not need to do this test if you get a colonoscopy every 10 years.  Flexible sigmoidoscopy or colonoscopy.** / Every 5 years for a flexible sigmoidoscopy or every 10 years for a colonoscopy beginning at age 33 years and continuing until age 63 years.  Hepatitis C blood test.** / For all people born from [birth year] through 1964 and any individual with known risks for hepatitis C.  Skin self-exam. / Monthly.  Influenza vaccine. / Every year.  Tetanus, diphtheria, and acellular pertussis (Tdap/Td) vaccine.** / Consult your health care provider. Pregnant women should receive 1 dose of Tdap vaccine during each pregnancy. 1 dose of Td every 10 years.  Varicella vaccine.** / Consult your health care provider. Pregnant females who do not have evidence of immunity should receive the first dose after pregnancy.  Zoster vaccine.** / 1 dose for adults aged 71 years or older.  Measles, mumps, rubella (MMR) vaccine.** / You need at least 1 dose of MMR if you were born in 1957 or later. You may also need a 2nd dose. For females of childbearing age, rubella immunity should be determined. If there is no evidence of immunity, females who are not pregnant should  be vaccinated. If there is no evidence of immunity, females who are pregnant should delay immunization until after pregnancy.  Pneumococcal 13-valent conjugate (PCV13) vaccine.** / Consult your health care provider.  Pneumococcal polysaccharide (PPSV23) vaccine.** / 1 to 2 doses if you smoke cigarettes or if you have certain conditions.  Meningococcal vaccine.** / Consult your health care provider.  Hepatitis A vaccine.** / Consult your health care provider.  Hepatitis B vaccine.** / Consult your health care provider.  Haemophilus influenzae type b (Hib) vaccine.** / Consult  your health care provider. Ages 40 years and over  Blood pressure check.** / Every 1 to 2 years.  Lipid and cholesterol check.** / Every 5 years beginning at age 36 years.  Lung cancer screening. / Every year if you are aged 64-80 years and have a 30-pack-year history of smoking and currently smoke or have quit within the past 15 years. Yearly screening is stopped once you have quit smoking for at least 15 years or develop a health problem that would prevent you from having lung cancer treatment.  Clinical breast exam.** / Every year after age 53 years.  BRCA-related cancer risk assessment.** / For women who have family members with a BRCA-related cancer (breast, ovarian, tubal, or peritoneal cancers).  Mammogram.** / Every year beginning at age 59 years and continuing for as long as you are in good health. Consult with your health care provider.  Pap test.** / Every 3 years starting at age 28 years through age 50 or 34 years with 3 consecutive normal Pap tests. Testing can be stopped between 65 and 70 years with 3 consecutive normal Pap tests and no abnormal Pap or HPV tests in the past 10 years.  HPV screening.** / Every 3 years from ages 3 years through ages 46 or 76 years with a history of 3 consecutive normal Pap tests. Testing can be stopped between 65 and 70 years with 3 consecutive normal Pap tests and no abnormal Pap or HPV tests in the past 10 years.  Fecal occult blood test (FOBT) of stool. / Every year beginning at age 74 years and continuing until age 92 years. You may not need to do this test if you get a colonoscopy every 10 years.  Flexible sigmoidoscopy or colonoscopy.** / Every 5 years for a flexible sigmoidoscopy or every 10 years for a colonoscopy beginning at age 87 years and continuing until age 65 years.  Hepatitis C blood test.** / For all people born from [birth year] through 1964 and any individual with known risks for hepatitis C.  Osteoporosis screening.** / A one-time  screening for women ages 49 years and over and women at risk for fractures or osteoporosis.  Skin self-exam. / Monthly.  Influenza vaccine. / Every year.  Tetanus, diphtheria, and acellular pertussis (Tdap/Td) vaccine.** / 1 dose of Td every 10 years.  Varicella vaccine.** / Consult your health care provider.  Zoster vaccine.** / 1 dose for adults aged 1 years or older.  Pneumococcal 13-valent conjugate (PCV13) vaccine.** / Consult your health care provider.  Pneumococcal polysaccharide (PPSV23) vaccine.** / 1 dose for all adults aged 48 years and older.  Meningococcal vaccine.** / Consult your health care provider.  Hepatitis A vaccine.** / Consult your health care provider.  Hepatitis B vaccine.** / Consult your health care provider.  Haemophilus influenzae type b (Hib) vaccine.** / Consult your health care provider. ** Family history and personal history of risk and conditions may change your health care provider's recommendations. Document Released: 11/07/2001  Document Revised: 01/26/2014 Document Reviewed: 02/06/2011 [facility] Patient Information 2015 [place], [place]. This information is not intended to replace advice given to you by your health care provider. Make sure you discuss any questions you have with your health care provider.

## 2014-04-17 NOTE — Assessment & Plan Note (Signed)
Rx Bactrim to take as directed.  Discussed alarm signs/symptoms with patient.  Patient aware of when to return to clinic or proceed to ER or UC.  Follow-up in 1 week.

## 2014-04-17 NOTE — Progress Notes (Signed)
Patient presents to clinic today to establish care.  Acute Concerns: Patient c/o pain, redness and swelling of RLE over the site of her new tattoo that she got 10 days ago.  Symptoms have been present for almost a week.  Denies drainage from site.  Denies pruritus.  Has never had this reaction to a tattoo before.  Denies fever, chills or malaise.  Patient also c/o palpitations while running or during exercises.  Patient describes "palpitations" as increased heart rate that is associated with dizziness and chest discomfort.  Denies diaphoresis, anxiety or syncope. Discomfort is non-radiating.  Denies history of HTN or diabetes.  Has history of Hyperlipidemia controlled with lipitor.  Has never had EST.   Chronic Issues: Anxiety and Depression -- well-controlled with combination of Zoloft and Wellbutrin. Denies SI/HI  Hyperlipidemia -- controlled previously with lipitor 40 mg.  Patient wishes to come off of medication. Will need a baseline fasting lipid panel.  Hypothyroidism -- currently treated with 100 mcg of levothyroxine.  If overdue for TFTs.  GERD -- controlled with Prilosec  Migraines -- currently on Topamax for migraine prophylaxis. Endorses good relief of symptoms.  Health Maintenance: Dental -- overdue Vision -- up-to-date Immunizations -- Tetanus 04/14/14 Colonoscopy -- 2014; no abnormal findings.  Due in 2024. Mammogram -- Due in August.  [facility] PAP -- s/p Hysterectomy; no hx of cervical cancer.    Past Medical History  Diagnosis Date  . Gall stones   . Abdominal pain   . Ovarian cyst   . Nausea & vomiting   . Wears glasses   . Allergy   . Hyperlipidemia   . Depression   . Anxiety   . Bell's palsy 01/12/2013  . Insomnia   . Acid reflux   . Hypothyroidism   . Migraine   . Headache     Past Surgical History  Procedure Laterality Date  . Abdominal hysterectomy    . Laparoscopic endometriosis fulguration    . Cholecystectomy    .  Wisdom tooth extraction      Current Outpatient Prescriptions on File Prior to Visit  Medication Sig Dispense Refill  . atorvastatin (LIPITOR) 40 MG tablet TAKE ONE (1) TABLET EACH DAY  30 tablet  0  . Cholecalciferol (VITAMIN D3) 2000 UNITS TABS Take 2,000 Units by mouth daily.      Marland Kitchen. levothyroxine (SYNTHROID, LEVOTHROID) 100 MCG tablet Take 1 tablet (100 mcg total) by mouth daily.  90 tablet  0  . loratadine (CLARITIN) 10 MG tablet Take 10 mg by mouth daily as needed.       . Probiotic Product (ALIGN) 4 MG CAPS Take 4 mg by mouth daily as needed.        No current facility-administered medications on file prior to visit.    No Known Allergies  Family History  Problem Relation Age of Onset  . Lung cancer Mother 2940    Deceased  . Heart disease Father 1172    Deceased  . Diabetes Father   . Heart disease Brother     Deceased  . Diabetes Brother   . Colon cancer Neg Hx   . Esophageal cancer Neg Hx   . Rectal cancer Neg Hx   . Stomach cancer Neg Hx   . Lung cancer Maternal Grandmother     History   Social History  . Marital Status: Married    Spouse Name: N/A    Number of Children: N/A  . Years of Education:  N/A   Occupational History  . Not on file.   Social History Main Topics  . Smoking status: Former Smoker    Quit date: 09/25/1998  . Smokeless tobacco: Never Used  . Alcohol Use: No     Comment: rare  . Drug Use: No  . Sexual Activity: Yes    Birth Control/ Protection: Surgical   Other Topics Concern  . Not on file   Social History Narrative  . No narrative on file   Review of Systems  Constitutional: Negative for fever and weight loss.  HENT: Negative for ear discharge, ear pain, hearing loss and tinnitus.   Eyes: Negative for blurred vision, double vision, photophobia, pain and discharge.  Respiratory: Negative for cough and shortness of breath.   Cardiovascular: Positive for palpitations. Negative for chest pain.  Gastrointestinal: Negative for  heartburn, nausea, vomiting, abdominal pain, diarrhea, constipation, blood in stool and melena.  Genitourinary: Negative for dysuria, urgency, frequency, hematuria and flank pain.  Neurological: Positive for dizziness. Negative for loss of consciousness and headaches.  Endo/Heme/Allergies: Positive for environmental allergies.  Psychiatric/Behavioral: Positive for depression. Negative for suicidal ideas, hallucinations and substance abuse. The patient is nervous/anxious. The patient does not have insomnia.    BP 120/86  Pulse 69  Temp(Src) 98.3 F (36.8 C) (Oral)  Resp 16  Ht 5\' 7"  (1.702 m)  Wt 173 lb 8 oz (78.699 kg)  BMI 27.17 kg/m2  SpO2 98%  LMP 10/20/1998  Physical Exam  Vitals reviewed. Constitutional: She is oriented to person, place, and time and well-developed, well-nourished, and in no distress.  HENT:  Head: Normocephalic and atraumatic.  Right Ear: External ear normal.  Left Ear: External ear normal.  Nose: Nose normal.  Mouth/Throat: Oropharynx is clear and moist. No oropharyngeal exudate.  TM within normal limits bilaterally.   Eyes: Conjunctivae and EOM are normal. Pupils are equal, round, and reactive to light.  Neck: Neck supple. No thyromegaly present.  Cardiovascular: Normal rate, regular rhythm, normal heart sounds and intact distal pulses.   Pulmonary/Chest: Effort normal and breath sounds normal. No respiratory distress. She has no wheezes. She has no rales. She exhibits no tenderness.  Abdominal: Soft. Bowel sounds are normal. She exhibits no distension and no mass. There is no tenderness. There is no rebound and no guarding.  Lymphadenopathy:    She has no cervical adenopathy.  Neurological: She is alert and oriented to person, place, and time. No cranial nerve deficit.  Skin: Skin is warm and dry.  Presence of a 8 cm erythematous region with tenderness and warmth noted of RLE at site of new tattoo without drainage or fluctuance.  Consistent with  cellulitis.  Psychiatric: Affect normal.   Assessment/Plan: Migraines Continue current regimen.   Gastroesophageal reflux disease without esophagitis Continue current regimen.  Depression Medications refilled.  Resume Zoloft.  For Wellbutrin, please start with 1 tablet daily x 3 days, then 2 tablets daily x 3 days, before resuming 3 tablets (450 mg) daily.   Other and unspecified hyperlipidemia Will obtain fasting lipid panel.  Continue aerobic exercise and dietary changes.  Palpitations Unclear etiology.  Will obtain TFTs.  EKG obtained and reveals NSR.  Referral to Cardiology placed for evaluation and EST. Order placed for Holter monitor placement.   Visit for preventive health examination Medical history reviewed.  Patient UTD on immunizations and colonoscopy. Due for mammogram in August.  [facility]  Will obtain fasting labs at today's visit.  Cellulitis of right lower extremity Rx Bactrim to take as directed.  Discussed alarm signs/symptoms with patient.  Patient aware of when to return to clinic or proceed to ER or UC.  Follow-up in 1 week.

## 2014-04-18 LAB — URINALYSIS, ROUTINE W REFLEX MICROSCOPIC
BILIRUBIN URINE: NEGATIVE
Glucose, UA: NEGATIVE mg/dL
Hgb urine dipstick: NEGATIVE
Ketones, ur: NEGATIVE mg/dL
Leukocytes, UA: NEGATIVE
Nitrite: NEGATIVE
PROTEIN: NEGATIVE mg/dL
Specific Gravity, Urine: 1.005 (ref 1.005–1.030)
UROBILINOGEN UA: 0.2 mg/dL (ref 0.0–1.0)
pH: 6.5 (ref 5.0–8.0)

## 2014-04-20 ENCOUNTER — Telehealth: Payer: Self-pay | Admitting: *Deleted

## 2014-04-20 DIAGNOSIS — L03119 Cellulitis of unspecified part of limb: Secondary | ICD-10-CM

## 2014-04-20 MED ORDER — CLINDAMYCIN HCL 300 MG PO CAPS: 300.0000 mg | ORAL_CAPSULE | Freq: Three times a day (TID) | ORAL | Status: DC

## 2014-04-20 NOTE — Telephone Encounter (Signed)
I think it is ok for her to hold off on the thyroid medication.  Will recheck TSH and T4 in 2 months.  If levels stay normal, will recheck at 928-month intervals.   For cholesterol I am ok with a 5628-month trial of diet and exercise.  She can hold off on cholesterol medication for now.  Follow up in 2 months.

## 2014-04-20 NOTE — Telephone Encounter (Signed)
Notified pt and she voices understanding. Doesn't think she has tried Clindamycin in the past and is agreeable to proceed with that medication to [facility] on [address]. Confirmed that she is taking a probiotic.  Notified pt of recent lab results. Pt also states that she has not taken thyroid medication or cholesterol medication in over 2 months and wants to know if she should remain off of them or resume them? She will need Rxs if she is to resume medications.  Please advise.

## 2014-04-20 NOTE — Telephone Encounter (Signed)
Pt left message that she is unable to take Bactrim as it makes her sick. Wants to know if she can go back on doxycycline as she is able to tolerate that medication.  Please advise.

## 2014-04-20 NOTE — Telephone Encounter (Signed)
I do not think Doxycycline is the best option as symptoms were not improving with that medication.  If she is unable to tolerate the bactrim, I will send in an Rx for Clindamycin.  She is to take a daily probiotic.  Follow-up as scheduled.

## 2014-04-21 ENCOUNTER — Encounter: Payer: Self-pay | Admitting: *Deleted

## 2014-04-21 ENCOUNTER — Encounter (INDEPENDENT_AMBULATORY_CARE_PROVIDER_SITE_OTHER): Payer: BC Managed Care – PPO

## 2014-04-21 DIAGNOSIS — R002 Palpitations: Secondary | ICD-10-CM

## 2014-04-21 NOTE — Telephone Encounter (Signed)
Notified pt. She has a 1 month follow up of depression scheduled for 05/11/14. Advised pt to keep that appt and can discuss lab appt for thyroid monitoring at that time. Pt voices understanding.

## 2014-04-21 NOTE — Progress Notes (Signed)
Patient ID: Margaret Robles, female   DOB: 05/13/1961, 53 y.o.   MRN: [id number] E-Cardio 24 hour holter monitor applied to patient.

## 2014-05-11 ENCOUNTER — Encounter: Payer: Self-pay | Admitting: Physician Assistant

## 2014-05-11 ENCOUNTER — Ambulatory Visit (INDEPENDENT_AMBULATORY_CARE_PROVIDER_SITE_OTHER): Payer: BC Managed Care – PPO | Admitting: Physician Assistant

## 2014-05-11 VITALS — BP 126/88 | HR 77 | Temp 98.3°F | Resp 16 | Ht 67.0 in | Wt 163.2 lb

## 2014-05-11 DIAGNOSIS — F329 Major depressive disorder, single episode, unspecified: Secondary | ICD-10-CM

## 2014-05-11 DIAGNOSIS — N951 Menopausal and female climacteric states: Secondary | ICD-10-CM

## 2014-05-11 DIAGNOSIS — E039 Hypothyroidism, unspecified: Secondary | ICD-10-CM | POA: Insufficient documentation

## 2014-05-11 DIAGNOSIS — F419 Anxiety disorder, unspecified: Principal | ICD-10-CM

## 2014-05-11 DIAGNOSIS — F341 Dysthymic disorder: Secondary | ICD-10-CM

## 2014-05-11 MED ORDER — ESTRADIOL 0.1 MG/GM VA CREA: TOPICAL_CREAM | VAGINAL | Status: DC

## 2014-05-11 NOTE — Progress Notes (Signed)
Pre visit review using our clinic review tool, if applicable. No additional management support is needed unless otherwise documented below in the visit note/SLS  

## 2014-05-11 NOTE — Assessment & Plan Note (Signed)
Doing very well.  Continue current regimen.  Follow-up in 6 months. 

## 2014-05-11 NOTE — Assessment & Plan Note (Signed)
Vaginal Estrace discussed.  Medications Rx'd.  Follow-up 3 months.

## 2014-05-11 NOTE — Assessment & Plan Note (Signed)
Asymptomatic.  Recent thyroid function testing within normal limits despite being off medications for over 3 months.  Will repeat TFTs in 3 months.

## 2014-05-11 NOTE — Progress Notes (Signed)
Patient presents to clinic today for follow-up of anxiety/depression and hypothyroidism.  Patient endorses doing very well after restarting her sertraline and bupropion. Endorses her mood is "great".  Feels like she has a lot of energy. Has been exercising more.  Denies SI/HI.  Patient also doing well off of her thyroid hormone.  HAs been off over 3 months.  TSH and T4 obtained at last visit were within normal limits.  Patient also wishes to discuss topical estrogens for vaginal symptoms.  Endorses history of recurrent UTI and yeast infections since hysterectomy.  Patient also endorses vaginal dryness that is impacting intimacy with her partner.  Denies current problems with hot flashes or night sweats.  Denies UTI or vaginitis symptoms at present.  Past Medical History  Diagnosis Date  . Gall stones   . Abdominal pain   . Ovarian cyst   . Nausea & vomiting   . Wears glasses   . Allergy   . Hyperlipidemia   . Depression   . Anxiety   . Bell's palsy 01/12/2013  . Insomnia   . Acid reflux   . Hypothyroidism   . Migraine   . Headache     Current Outpatient Prescriptions on File Prior to Visit  Medication Sig Dispense Refill  . atorvastatin (LIPITOR) 40 MG tablet TAKE ONE (1) TABLET EACH DAY  30 tablet  0  . baclofen (LIORESAL) 10 MG tablet Take 1 tablet (10 mg total) by mouth as needed.  30 each  5  . buPROPion (WELLBUTRIN XL) 150 MG 24 hr tablet take 3 tabs daily  90 tablet  2  . Cholecalciferol (VITAMIN D3) 2000 UNITS TABS Take 2,000 Units by mouth daily.      . Doxepin HCl 3 MG TABS Take 1 tablet by mouth at bedtime as needed.      Marland Kitchen levothyroxine (SYNTHROID, LEVOTHROID) 100 MCG tablet Take 1 tablet (100 mcg total) by mouth daily.  90 tablet  0  . loratadine (CLARITIN) 10 MG tablet Take 10 mg by mouth daily as needed.       . Multiple Vitamin (MULTIVITAMIN) tablet Take 1 tablet by mouth daily.      Marland Kitchen omeprazole (PRILOSEC) 20 MG capsule Take 1 capsule (20 mg total) by mouth daily.  30  capsule  3  . Probiotic Product (ALIGN) 4 MG CAPS Take 4 mg by mouth daily as needed.       . sertraline (ZOLOFT) 100 MG tablet TAKE ONE (1) TABLET EACH DAY  30 tablet  0  . topiramate (TOPAMAX) 100 MG tablet TAKE ONE TABLET BY MOUTH TWICE DAILY  60 tablet  0   No current facility-administered medications on file prior to visit.    Allergies  Allergen Reactions  . Bactrim [Sulfamethoxazole-Tmp Ds] Nausea Only    Family History  Problem Relation Age of Onset  . Lung cancer Mother 63    Deceased  . Heart disease Father 69    Deceased  . Diabetes Father   . Heart disease Brother     Deceased  . Diabetes Brother   . Colon cancer Neg Hx   . Esophageal cancer Neg Hx   . Rectal cancer Neg Hx   . Stomach cancer Neg Hx   . Lung cancer Maternal Grandmother     History   Social History  . Marital Status: Married    Spouse Name: N/A    Number of Children: N/A  . Years of Education: N/A   Social History Main  Topics  . Smoking status: Former Smoker    Quit date: 09/25/1998  . Smokeless tobacco: Never Used  . Alcohol Use: No     Comment: rare  . Drug Use: No  . Sexual Activity: Yes    Birth Control/ Protection: Surgical   Other Topics Concern  . None   Social History Narrative  . None    Review of Systems - See HPI.  All other ROS are negative.  BP 126/88  Pulse 77  Temp(Src) 98.3 F (36.8 C) (Oral)  Resp 16  Ht 5\' 7"  (1.702 m)  Wt 163 lb 4 oz (74.05 kg)  BMI 25.56 kg/m2  SpO2 99%  LMP 10/20/1998  Physical Exam  Vitals reviewed. Constitutional: She is oriented to person, place, and time and well-developed, well-nourished, and in no distress.  HENT:  Head: Normocephalic and atraumatic.  Eyes: Conjunctivae are normal.  Neck: Neck supple. No thyromegaly present.  Cardiovascular: Normal rate, regular rhythm, normal heart sounds and intact distal pulses.   Pulmonary/Chest: Effort normal and breath sounds normal. No respiratory distress. She has no wheezes. She  has no rales. She exhibits no tenderness.  Neurological: She is alert and oriented to person, place, and time.  Skin: Skin is warm and dry. No rash noted.  Psychiatric: Affect normal.    Recent Results (from the past 2160 hour(s))  CBC     Status: None   Collection Time    04/17/14  9:08 AM      Result Value Ref Range   WBC 5.7  4.0 - 10.5 K/uL   RBC 4.66  3.87 - 5.11 MIL/uL   Hemoglobin 15.0  12.0 - 15.0 g/dL   HCT 78.4  69.6 - 29.5 %   MCV 93.1  78.0 - 100.0 fL   MCH 32.2  26.0 - 34.0 pg   MCHC 34.6  30.0 - 36.0 g/dL   RDW 28.4  13.2 - 44.0 %   Platelets 390  150 - 400 K/uL  COMPREHENSIVE METABOLIC PANEL     Status: None   Collection Time    04/17/14  9:08 AM      Result Value Ref Range   Sodium 141  135 - 145 mEq/L   Potassium 4.7  3.5 - 5.3 mEq/L   Chloride 105  96 - 112 mEq/L   CO2 28  19 - 32 mEq/L   Glucose, Bld 93  70 - 99 mg/dL   BUN 11  6 - 23 mg/dL   Creat 1.02  7.25 - 3.66 mg/dL   Total Bilirubin 0.5  0.2 - 1.2 mg/dL   Alkaline Phosphatase 88  39 - 117 U/L   AST 30  0 - 37 U/L   ALT 27  0 - 35 U/L   Total Protein 7.6  6.0 - 8.3 g/dL   Albumin 4.2  3.5 - 5.2 g/dL   Calcium 9.6  8.4 - 44.0 mg/dL  TSH     Status: None   Collection Time    04/17/14  9:08 AM      Result Value Ref Range   TSH 2.496  0.350 - 4.500 uIU/mL  T4     Status: None   Collection Time    04/17/14  9:08 AM      Result Value Ref Range   T4, Total 9.5  5.0 - 12.5 ug/dL  URINALYSIS, ROUTINE W REFLEX MICROSCOPIC     Status: None   Collection Time    04/17/14  9:08 AM  Result Value Ref Range   Color, Urine YELLOW  YELLOW   APPearance CLEAR  CLEAR   Specific Gravity, Urine 1.005  1.005 - 1.030   pH 6.5  5.0 - 8.0   Glucose, UA NEG  NEG mg/dL   Bilirubin Urine NEG  NEG   Ketones, ur NEG  NEG mg/dL   Hgb urine dipstick NEG  NEG   Protein, ur NEG  NEG mg/dL   Urobilinogen, UA 0.2  0.0 - 1.0 mg/dL   Nitrite NEG  NEG   Leukocytes, UA NEG  NEG  LIPID PANEL     Status: Abnormal    Collection Time    04/17/14  9:08 AM      Result Value Ref Range   Cholesterol 193  0 - 200 mg/dL   Comment: ATP III Classification:           < 200        mg/dL        Desirable          200 - 239     mg/dL        Borderline High          >= 240        mg/dL        High         Triglycerides 122  <150 mg/dL   HDL 68  >16 mg/dL   Total CHOL/HDL Ratio 2.8     VLDL 24  0 - 40 mg/dL   LDL Cholesterol 109 (*) 0 - 99 mg/dL   Comment:       Total Cholesterol/HDL Ratio:CHD Risk                            Coronary Heart Disease Risk Table                                            Men       Women              1/2 Average Risk              3.4        3.3                  Average Risk              5.0        4.4               2X Average Risk              9.6        7.1               3X Average Risk             23.4       11.0     Use the calculated Patient Ratio above and the CHD Risk table      to determine the patient's CHD Risk.     ATP III Classification (LDL):           < 100        mg/dL         Optimal          100 - 129  mg/dL         Near or Above Optimal          130 - 159     mg/dL         Borderline High          160 - 189     mg/dL         High           > 190        mg/dL         Very High          Assessment/Plan: Anxiety and depression Doing very well.  Continue current regimen.  Follow-up in 6 months.  Vaginal dryness, menopausal Vaginal Estrace discussed.  Medications Rx'd.  Follow-up 3 months.  Unspecified hypothyroidism Asymptomatic.  Recent thyroid function testing within normal limits despite being off medications for over 3 months.  Will repeat TFTs in 3 months.

## 2014-05-11 NOTE — Patient Instructions (Signed)
Please continue medications as directed.  Follow-up in 3 months so we can recheck your thyroid.  Please use Estrace Vaginal cream as directed.  Let me know if symptoms are not improving.

## 2014-05-14 ENCOUNTER — Other Ambulatory Visit: Payer: Self-pay | Admitting: Physician Assistant

## 2014-05-15 NOTE — Telephone Encounter (Signed)
1 Rx sent to pharmacy. 2 Rx waiting on provider. LDM

## 2014-05-27 ENCOUNTER — Ambulatory Visit: Payer: BC Managed Care – PPO | Admitting: Cardiology

## 2014-06-02 ENCOUNTER — Ambulatory Visit: Payer: BC Managed Care – PPO | Admitting: Cardiovascular Disease

## 2014-06-03 ENCOUNTER — Ambulatory Visit: Payer: BC Managed Care – PPO | Admitting: Cardiology

## 2014-06-16 ENCOUNTER — Other Ambulatory Visit: Payer: Self-pay | Admitting: *Deleted

## 2014-06-16 MED ORDER — TOPIRAMATE 100 MG PO TABS: ORAL_TABLET | ORAL | Status: DC

## 2014-06-16 MED ORDER — SERTRALINE HCL 100 MG PO TABS: 100.0000 mg | ORAL_TABLET | Freq: Every day | ORAL | Status: DC

## 2014-06-16 NOTE — Addendum Note (Signed)
Addended by: Eustace Quail on: 06/16/2014 03:12 PM   Modules accepted: Orders

## 2014-06-17 ENCOUNTER — Telehealth: Payer: Self-pay | Admitting: *Deleted

## 2014-06-17 DIAGNOSIS — K219 Gastro-esophageal reflux disease without esophagitis: Secondary | ICD-10-CM

## 2014-06-17 MED ORDER — OMEPRAZOLE 20 MG PO CPDR: 20.0000 mg | DELAYED_RELEASE_CAPSULE | Freq: Every day | ORAL | Status: DC

## 2014-06-17 NOTE — Telephone Encounter (Signed)
Rx request to pharmacy/[facility]  

## 2014-06-18 ENCOUNTER — Other Ambulatory Visit: Payer: Self-pay

## 2014-06-18 DIAGNOSIS — Z1231 Encounter for screening mammogram for malignant neoplasm of breast: Secondary | ICD-10-CM

## 2014-07-01 ENCOUNTER — Ambulatory Visit: Payer: BC Managed Care – PPO | Admitting: Medical

## 2014-07-01 DIAGNOSIS — Z0289 Encounter for other administrative examinations: Secondary | ICD-10-CM

## 2014-07-09 ENCOUNTER — Ambulatory Visit
Admission: RE | Admit: 2014-07-09 | Discharge: 2014-07-09 | Disposition: A | Discharge status: Home / Self Care | Payer: BC Managed Care – PPO | Source: Ambulatory Visit

## 2014-07-09 DIAGNOSIS — Z1231 Encounter for screening mammogram for malignant neoplasm of breast: Secondary | ICD-10-CM

## 2014-07-15 ENCOUNTER — Ambulatory Visit: Payer: BC Managed Care – PPO | Admitting: Cardiology

## 2014-08-25 ENCOUNTER — Other Ambulatory Visit: Payer: Self-pay | Admitting: Physician Assistant

## 2014-09-17 ENCOUNTER — Ambulatory Visit (INDEPENDENT_AMBULATORY_CARE_PROVIDER_SITE_OTHER): Payer: BC Managed Care – PPO | Admitting: Medical

## 2014-09-17 ENCOUNTER — Encounter: Payer: Self-pay | Admitting: Medical

## 2014-09-17 VITALS — BP 129/87 | HR 71 | Temp 98.1°F | Ht 67.0 in | Wt 157.0 lb

## 2014-09-17 DIAGNOSIS — N390 Urinary tract infection, site not specified: Secondary | ICD-10-CM | POA: Insufficient documentation

## 2014-09-17 DIAGNOSIS — N3000 Acute cystitis without hematuria: Secondary | ICD-10-CM

## 2014-09-17 DIAGNOSIS — R300 Dysuria: Secondary | ICD-10-CM

## 2014-09-17 LAB — POCT URINALYSIS DIPSTICK
BILIRUBIN UA: NEGATIVE
GLUCOSE UA: NEGATIVE
Ketones, UA: NEGATIVE
Nitrite, UA: NEGATIVE
Protein, UA: NEGATIVE
SPEC GRAV UA: 1.02
Urobilinogen, UA: 0.2
pH, UA: 6

## 2014-09-17 MED ORDER — NITROFURANTOIN MONOHYD MACRO 100 MG PO CAPS: 100.0000 mg | ORAL_CAPSULE | Freq: Two times a day (BID) | ORAL | Status: DC

## 2014-09-17 NOTE — Progress Notes (Signed)
Pre visit review using our clinic review tool, if applicable. No additional management support is needed unless otherwise documented below in the visit note. 

## 2014-09-17 NOTE — Assessment & Plan Note (Signed)
You appear to have a urinary tract infection. I am prescribing  macrobid antibiotic for the probable infection. Hydrate well. I am sending out a urine culture. During the interim if your signs and symptoms worsen rather than improving please notify us. We will notify your when the culture results are back.

## 2014-09-17 NOTE — Patient Instructions (Addendum)
Your appear to have a urinary tract infection. I am prescribing  macrobid antibiotic for the probable infection. Hydrate well. I am sending out a urine culture. During the interim if your signs and symptoms worsen rather than improving please notify us. We will notify your when the culture results are back.  Follow up in 7 days or as needed.  Note pt not having any described urinary retention as was noted in reason for visit. At best some hesitant and intermitent urine flow but that is rare. And she is able to urinate.

## 2014-09-17 NOTE — Progress Notes (Signed)
Subjective:    Patient ID: Margaret Robles, female    DOB: 03/31/1961, 53 y.o.   MRN: [id number]  HPI   Pt in today reporting urinary symptoms.(x 2 days)    Dysuria- yes(with odor) Frequent urination-yes Hesitancy-yes. Mild. Suprapubic pressure- Fever-no chills-no Nausea-no Vomiting-no CVA pain-no History of UTI-3rd uti in past 6 months. Gross hematuria-no  Past Medical History  Diagnosis Date  . Gall stones   . Abdominal pain   . Ovarian cyst   . Nausea & vomiting   . Wears glasses   . Allergy   . Hyperlipidemia   . Depression   . Anxiety   . Bell's palsy 01/12/2013  . Insomnia   . Acid reflux   . Hypothyroidism   . Migraine   . Headache     History   Social History  . Marital Status: Married    Spouse Name: N/A    Number of Children: N/A  . Years of Education: N/A   Occupational History  . Not on file.   Social History Main Topics  . Smoking status: Former Smoker    Quit date: 09/25/1998  . Smokeless tobacco: Never Used  . Alcohol Use: No     Comment: rare  . Drug Use: No  . Sexual Activity: Yes    Birth Control/ Protection: Surgical   Other Topics Concern  . Not on file   Social History Narrative    Past Surgical History  Procedure Laterality Date  . Abdominal hysterectomy    . Laparoscopic endometriosis fulguration    . Cholecystectomy    . Wisdom tooth extraction      Family History  Problem Relation Age of Onset  . Lung cancer Mother 5840    Deceased  . Heart disease Father 3372    Deceased  . Diabetes Father   . Heart disease Brother     Deceased  . Diabetes Brother   . Colon cancer Neg Hx   . Esophageal cancer Neg Hx   . Rectal cancer Neg Hx   . Stomach cancer Neg Hx   . Lung cancer Maternal Grandmother     Allergies  Allergen Reactions  . Bactrim [Sulfamethoxazole-Trimethoprim] Nausea Only    Current Outpatient Prescriptions on File Prior to Visit  Medication Sig Dispense Refill  . atorvastatin (LIPITOR) 40 MG  tablet TAKE ONE (1) TABLET EACH DAY 30 tablet 0  . baclofen (LIORESAL) 10 MG tablet Take 1 tablet (10 mg total) by mouth as needed. 30 each 5  . buPROPion (WELLBUTRIN XL) 150 MG 24 hr tablet TAKE 3 TABLETS BY MOUTH EVERY DAY 90 tablet 2  . Cholecalciferol (VITAMIN D3) 2000 UNITS TABS Take 2,000 Units by mouth daily.    . Doxepin HCl 3 MG TABS Take 1 tablet by mouth at bedtime as needed.    Marland Kitchen. estradiol (ESTRACE VAGINAL) 0.1 MG/GM vaginal cream Apply 1 applicator full 3 times weekly for 2 weeks.  Then apply once weekly. 42.5 g 12  . levothyroxine (SYNTHROID, LEVOTHROID) 100 MCG tablet Take 1 tablet (100 mcg total) by mouth daily. 90 tablet 0  . loratadine (CLARITIN) 10 MG tablet Take 10 mg by mouth daily as needed.     . Multiple Vitamin (MULTIVITAMIN) tablet Take 1 tablet by mouth daily.    Marland Kitchen. omeprazole (PRILOSEC) 20 MG capsule Take 1 capsule (20 mg total) by mouth daily. 90 capsule 0  . Probiotic Product (ALIGN) 4 MG CAPS Take 4 mg by mouth daily as needed.     .Marland Kitchen  sertraline (ZOLOFT) 100 MG tablet Take 1 tablet (100 mg total) by mouth daily. 90 tablet 0  . topiramate (TOPAMAX) 100 MG tablet TAKE 1 TABLET BY MOUTH TWICE A DAY 180 tablet 0   No current facility-administered medications on file prior to visit.    BP 129/87 mmHg  Pulse 71  Temp(Src) 98.1 F (36.7 C) (Oral)  Ht 5\' 7"  (1.702 m)  Wt 157 lb (71.215 kg)  BMI 24.58 kg/m2  SpO2 97%  LMP 10/20/1998     Review of Systems  Constitutional: Negative for fever, chills and fatigue.  Respiratory: Negative for chest tightness and wheezing.   Cardiovascular: Negative for chest pain and palpitations.  Genitourinary: Positive for dysuria, urgency and frequency. Negative for hematuria, flank pain, vaginal bleeding, genital sores, vaginal pain and pelvic pain.  Musculoskeletal: Negative for back pain.       Objective:   Physical Exam   General  Mental Status- Alert. Orientation- Orientation x 4.   Skin General:- Normal. Moisture-  Dry. Temperature- Warm.  HEENT Head- normal.  Neck Neck- Supple.  Heart Ausculation-RRR  Lungs Ausculation- Clear, even, unlabored bilaterlly.    Abdomen Palpation/Percussion: Palpation and Percussion of the abdomen reveal- suprapubic Tenderness, No Rebound tenderness, No Rigidity(guarding), No Palpable abdominal masses and No jar tenderness. No suprapubic tenderness. Liver:-Normal. Spleen:- Normal. Other Characteristics- No Costovertebral angle tenderness- Left or Costovertebral angle tenderness- Right.  Auscultation: Auscultation of the abdomen reveals- Bowel Sounds normal.        Assessment & Plan:

## 2014-11-11 ENCOUNTER — Other Ambulatory Visit: Payer: Self-pay | Admitting: Physician Assistant

## 2014-11-11 NOTE — Telephone Encounter (Signed)
Rx request to pharmacy/SLS Requested drug refills are authorized, however, the patient needs further evaluation and/or laboratory testing before further refills are given. Ask her to make an appointment for this.  LAST OV: 08.17.2015 ROV: 3-Mths; No future appointment scheduled  Please arrange F/U appointment overdue form 11.2015 prior to future refills per provider instructions/SLS Thanks.

## 2014-11-11 NOTE — Telephone Encounter (Signed)
Informed patient of medication refill and she scheduled appointment

## 2014-11-16 ENCOUNTER — Encounter: Payer: Self-pay | Admitting: Physician Assistant

## 2014-11-16 ENCOUNTER — Ambulatory Visit (INDEPENDENT_AMBULATORY_CARE_PROVIDER_SITE_OTHER): Payer: 59 | Admitting: Physician Assistant

## 2014-11-16 ENCOUNTER — Ambulatory Visit (HOSPITAL_BASED_OUTPATIENT_CLINIC_OR_DEPARTMENT_OTHER)
Admission: RE | Admit: 2014-11-16 | Discharge: 2014-11-16 | Disposition: A | Discharge status: Home / Self Care | Payer: 59 | Source: Ambulatory Visit | Attending: Physician Assistant | Admitting: Physician Assistant

## 2014-11-16 VITALS — BP 128/77 | HR 82 | Temp 98.0°F | Resp 16 | Ht 67.0 in | Wt 166.1 lb

## 2014-11-16 DIAGNOSIS — F418 Other specified anxiety disorders: Secondary | ICD-10-CM

## 2014-11-16 DIAGNOSIS — F419 Anxiety disorder, unspecified: Secondary | ICD-10-CM

## 2014-11-16 DIAGNOSIS — M545 Low back pain: Secondary | ICD-10-CM | POA: Insufficient documentation

## 2014-11-16 DIAGNOSIS — K219 Gastro-esophageal reflux disease without esophagitis: Secondary | ICD-10-CM

## 2014-11-16 DIAGNOSIS — N3000 Acute cystitis without hematuria: Secondary | ICD-10-CM

## 2014-11-16 DIAGNOSIS — F32A Depression, unspecified: Secondary | ICD-10-CM

## 2014-11-16 DIAGNOSIS — F329 Major depressive disorder, single episode, unspecified: Secondary | ICD-10-CM

## 2014-11-16 DIAGNOSIS — M25552 Pain in left hip: Secondary | ICD-10-CM | POA: Diagnosis not present

## 2014-11-16 DIAGNOSIS — M79605 Pain in left leg: Secondary | ICD-10-CM | POA: Insufficient documentation

## 2014-11-16 DIAGNOSIS — M5442 Lumbago with sciatica, left side: Secondary | ICD-10-CM

## 2014-11-16 MED ORDER — BUPROPION HCL ER (XL) 150 MG PO TB24: 450.0000 mg | ORAL_TABLET | Freq: Every day | ORAL | Status: DC

## 2014-11-16 MED ORDER — CIPROFLOXACIN HCL 500 MG PO TABS: 500.0000 mg | ORAL_TABLET | Freq: Two times a day (BID) | ORAL | Status: DC

## 2014-11-16 MED ORDER — TOPIRAMATE 100 MG PO TABS: ORAL_TABLET | ORAL | Status: DC

## 2014-11-16 MED ORDER — METHYLPREDNISOLONE (PAK) 4 MG PO TABS: ORAL_TABLET | ORAL | Status: DC

## 2014-11-16 MED ORDER — SERTRALINE HCL 100 MG PO TABS: 100.0000 mg | ORAL_TABLET | Freq: Every day | ORAL | Status: DC

## 2014-11-16 MED ORDER — OMEPRAZOLE 20 MG PO CPDR: 20.0000 mg | DELAYED_RELEASE_CAPSULE | Freq: Every day | ORAL | Status: DC

## 2014-11-16 NOTE — Patient Instructions (Signed)
Please take the Cipro as directed for urinary tract infection symptoms.  Increase your fluid intake.  Continue your daily probiotic.  I am setting you up with a Urologist giving you recurrent UTIs.   For the back, avoid heavy lifting or overexertion.  Take the Medrol pack as directed.  Go downstairs for x-ray.  I will call you with your results.  Use extra-strength tylenol if needed for breakthrough pain.  For the hot flashes, I recommend you speak to your pharmacist about a Black Cohosh supplement to take daily.  Wear breathable materials and turn the thermostat down a couple of degrees at bedtime.  Follow-up if symptoms are not improving.  Please continue your other medications as directed.  Let me know if there is still any issue getting medication quantities from your pharmacy.  Urinary Tract Infection Urinary tract infections (UTIs) can develop anywhere along your urinary tract. Your urinary tract is your body's drainage system for removing wastes and extra water. Your urinary tract includes two kidneys, two ureters, a bladder, and a urethra. Your kidneys are a pair of bean-shaped organs. Each kidney is about the size of your fist. They are located below your ribs, one on each side of your spine. CAUSES Infections are caused by microbes, which are microscopic organisms, including fungi, viruses, and bacteria. These organisms are so small that they can only be seen through a microscope. Bacteria are the microbes that most commonly cause UTIs. SYMPTOMS  Symptoms of UTIs may vary by age and gender of the patient and by the location of the infection. Symptoms in young women typically include a frequent and intense urge to urinate and a painful, burning feeling in the bladder or urethra during urination. Older women and men are more likely to be tired, shaky, and weak and have muscle aches and abdominal pain. A fever may mean the infection is in your kidneys. Other symptoms of a kidney infection include  pain in your back or sides below the ribs, nausea, and vomiting. DIAGNOSIS To diagnose a UTI, your caregiver will ask you about your symptoms. Your caregiver also will ask to provide a urine sample. The urine sample will be tested for bacteria and white blood cells. White blood cells are made by your body to help fight infection. TREATMENT  Typically, UTIs can be treated with medication. Because most UTIs are caused by a bacterial infection, they usually can be treated with the use of antibiotics. The choice of antibiotic and length of treatment depend on your symptoms and the type of bacteria causing your infection. HOME CARE INSTRUCTIONS  If you were prescribed antibiotics, take them exactly as your caregiver instructs you. Finish the medication even if you feel better after you have only taken some of the medication.  Drink enough water and fluids to keep your urine clear or pale yellow.  Avoid caffeine, tea, and carbonated beverages. They tend to irritate your bladder.  Empty your bladder often. Avoid holding urine for long periods of time.  Empty your bladder before and after sexual intercourse.  After a bowel movement, women should cleanse from front to back. Use each tissue only once. SEEK MEDICAL CARE IF:   You have back pain.  You develop a fever.  Your symptoms do not begin to resolve within 3 days. SEEK IMMEDIATE MEDICAL CARE IF:   You have severe back pain or lower abdominal pain.  You develop chills.  You have nausea or vomiting.  You have continued burning or discomfort with  urination. MAKE SURE YOU:   Understand these instructions.  Will watch your condition.  Will get help right away if you are not doing well or get worse. Document Released: 06/21/2005 Document Revised: 03/12/2012 Document Reviewed: 10/20/2011 [facility] Patient Information 2015 [employer], [place]. This information is not intended to replace advice given to you by your health care provider. Make  sure you discuss any questions you have with your health care provider.

## 2014-11-16 NOTE — Progress Notes (Signed)
Pre visit review using our clinic review tool, if applicable. No additional management support is needed unless otherwise documented below in the visit note/SLS  

## 2014-11-16 NOTE — Progress Notes (Signed)
Patient presents to clinic today for medication management and with multiple complaints.  Patient with history of depression and anxiety, currently on Wellbutrin Xl and Zoloft on max doses.  Endorses good control of symptoms.  Endorses stable mood.  Denies SI/HI.  Denies panic attack.  Patient wishes to discuss treatment for perimenopausal hot flashes.  Does not wish to begin HRT.  Wants to discuss herbal supplements and OTC measures.  Patient also wishes to discuss chronic low back and hip pain.  Denies trauma or injury.  Endorses pain sometimes radiates down her leg. Denies saddle anesthesia or change to bowel/bladder habits.  Patient complains of urinary urgency and frequency over the past few days.  Some mild dysuria.  Denies fever, chills, N/V or flank pain. Some suprapubic pressure.  Has history of UTI. Is no longer taking her Estrace cream due to cost with new insurance.  Past Medical History  Diagnosis Date  . Gall stones   . Abdominal pain   . Ovarian cyst   . Nausea & vomiting   . Wears glasses   . Allergy   . Hyperlipidemia   . Depression   . Anxiety   . Bell's palsy 01/12/2013  . Insomnia   . Acid reflux   . Hypothyroidism   . Migraine   . Headache     Current Outpatient Prescriptions on File Prior to Visit  Medication Sig Dispense Refill  . baclofen (LIORESAL) 10 MG tablet Take 1 tablet (10 mg total) by mouth as needed. 30 each 5  . Cholecalciferol (VITAMIN D3) 2000 UNITS TABS Take 2,000 Units by mouth daily.    Marland Kitchen loratadine (CLARITIN) 10 MG tablet Take 10 mg by mouth daily as needed.     . Multiple Vitamin (MULTIVITAMIN) tablet Take 1 tablet by mouth daily.    . Probiotic Product (ALIGN) 4 MG CAPS Take 4 mg by mouth daily as needed.     . Doxepin HCl 3 MG TABS Take 1 tablet by mouth at bedtime as needed.     No current facility-administered medications on file prior to visit.    Allergies  Allergen Reactions  . Bactrim [Sulfamethoxazole-Trimethoprim]  Nausea Only    Family History  Problem Relation Age of Onset  . Lung cancer Mother 52    Deceased  . Heart disease Father 81    Deceased  . Diabetes Father   . Heart disease Brother     Deceased  . Diabetes Brother   . Colon cancer Neg Hx   . Esophageal cancer Neg Hx   . Rectal cancer Neg Hx   . Stomach cancer Neg Hx   . Lung cancer Maternal Grandmother     History   Social History  . Marital Status: Married    Spouse Name: N/A  . Number of Children: N/A  . Years of Education: N/A   Social History Main Topics  . Smoking status: Former Smoker    Quit date: 09/25/1998  . Smokeless tobacco: Never Used  . Alcohol Use: No     Comment: rare  . Drug Use: No  . Sexual Activity: Yes    Birth Control/ Protection: Surgical   Other Topics Concern  . None   Social History Narrative   Review of Systems - See HPI.  All other ROS are negative.  BP 128/77 mmHg  Pulse 82  Temp(Src) 98 F (36.7 C) (Oral)  Resp 16  Ht  (1.702 m)  Wt 166 lb 2 oz (75.354  kg)  BMI 26.01 kg/m2  SpO2 100%  LMP 10/20/1998  Physical Exam  Constitutional: She is oriented to person, place, and time and well-developed, well-nourished, and in no distress.  HENT:  Head: Normocephalic and atraumatic.  Eyes: Conjunctivae are normal.  Neck: Neck supple.  Cardiovascular: Normal rate, regular rhythm, normal heart sounds and intact distal pulses.   Pulmonary/Chest: Effort normal and breath sounds normal. No respiratory distress. She has no wheezes. She has no rales. She exhibits no tenderness.  Abdominal: Soft. Bowel sounds are normal. She exhibits no distension and no mass. There is no tenderness. There is no rebound and no guarding.  Musculoskeletal:       Lumbar back: She exhibits tenderness and pain. She exhibits normal range of motion, no bony tenderness, no swelling and no spasm.  Neurological: She is alert and oriented to person, place, and time.  Skin: Skin is warm and dry. No rash noted.    Psychiatric: Affect normal.  Vitals reviewed.   Recent Results (from the past 2160 hour(s))  POCT Urinalysis Dipstick     Status: Abnormal (In process)   Collection Time: 09/17/14 10:24 AM  Result Value Ref Range   Color, UA Golden    Clarity, UA Clear    Glucose, UA Neg    Bilirubin, UA Neg    Ketones, UA Neg    Spec Grav, UA 1.020    Blood, UA Trace    pH, UA 6.0    Protein, UA Neg    Urobilinogen, UA 0.2    Nitrite, UA Neg    Leukocytes, UA Trace   CULTURE, URINE COMPREHENSIVE     Status: None   Collection Time: 11/16/14  5:00 PM  Result Value Ref Range   Culture ESCHERICHIA COLI    Colony Count 5,000 COLONIES/ML    Organism ID, Bacteria ESCHERICHIA COLI       Susceptibility   Escherichia coli -  (no method available)    AMPICILLIN <=2 Sensitive     AMOX/CLAVULANIC <=2 Sensitive     AMPICILLIN/SULBACTAM <=2 Sensitive     PIP/TAZO <=4 Sensitive     IMIPENEM <=0.25 Sensitive     CEFAZOLIN <=4 Sensitive     CEFTRIAXONE <=1 Sensitive     CEFTAZIDIME <=1 Sensitive     CEFEPIME <=1 Sensitive     GENTAMICIN <=1 Sensitive     TOBRAMYCIN <=1 Sensitive     CIPROFLOXACIN <=0.25 Sensitive     LEVOFLOXACIN <=0.12 Sensitive     NITROFURANTOIN <=16 Sensitive     TRIMETH/SULFA <=20 Sensitive     Assessment/Plan: UTI (urinary tract infection) Urine dip with some abnormalities. Giving symptoms, will empirically treat with Cipro 500 mg BID x 3 days. Will send urine for culture.  Will alter regimen based on results. Increase fluids. Take a daily cranberry supplement of probiotic. Alarm signs/symptoms discussed. Return PRN if no improvement.   Anxiety and depression Well-controlled.  Medications refilled.  Continue current regimen.   Left-sided low back pain with left-sided sciatica Will obtain x-ray.   Rx medrol dose pack.   Exercises and supportive measures discussed with patient.   Giving chronicity of symptoms, will likely have to proceed with PT and/or  MRI.

## 2014-11-18 ENCOUNTER — Ambulatory Visit: Payer: Self-pay | Admitting: Physician Assistant

## 2014-11-19 ENCOUNTER — Encounter: Payer: Self-pay | Admitting: Physician Assistant

## 2014-11-19 LAB — CULTURE, URINE COMPREHENSIVE: Colony Count: 5000

## 2014-11-20 ENCOUNTER — Telehealth: Payer: Self-pay

## 2014-11-20 ENCOUNTER — Other Ambulatory Visit: Payer: Self-pay | Admitting: Physician Assistant

## 2014-11-20 DIAGNOSIS — N3000 Acute cystitis without hematuria: Secondary | ICD-10-CM

## 2014-11-20 NOTE — Telephone Encounter (Signed)
While discussing lab and image results, pt states she never heard back from pharmacy regarding refills on her Wellbutrin, Zoloft, and Topamax.  States Selena BattenCody was suppose to call regarding these meds and wants to know if this has been done.  Per chart medications were refilled [facility] at [facility].  Pt is aware.

## 2014-11-20 NOTE — Telephone Encounter (Signed)
Spoke with patient again and she said that [facility] had just called and made her aware that her prescriptions were ready for pick up.

## 2014-11-23 DIAGNOSIS — M5442 Lumbago with sciatica, left side: Secondary | ICD-10-CM | POA: Insufficient documentation

## 2014-11-23 NOTE — Assessment & Plan Note (Signed)
Well controlled.  Medications refilled.  Continue current regimen. 

## 2014-11-23 NOTE — Assessment & Plan Note (Signed)
Will obtain x-ray.   Rx medrol dose pack.   Exercises and supportive measures discussed with patient.   Giving chronicity of symptoms, will likely have to proceed with PT and/or MRI.

## 2014-11-23 NOTE — Assessment & Plan Note (Signed)
Urine dip with some abnormalities. Giving symptoms, will empirically treat with Cipro 500 mg BID x 3 days. Will send urine for culture.  Will alter regimen based on results. Increase fluids. Take a daily cranberry supplement of probiotic. Alarm signs/symptoms discussed. Return PRN if no improvement.

## 2014-12-03 ENCOUNTER — Ambulatory Visit: Payer: 59 | Admitting: Medical

## 2014-12-23 ENCOUNTER — Telehealth: Payer: Self-pay | Admitting: *Deleted

## 2014-12-23 DIAGNOSIS — K219 Gastro-esophageal reflux disease without esophagitis: Secondary | ICD-10-CM

## 2014-12-23 MED ORDER — OMEPRAZOLE 20 MG PO CPDR: 20.0000 mg | DELAYED_RELEASE_CAPSULE | Freq: Every day | ORAL | Status: AC

## 2014-12-23 MED ORDER — BUPROPION HCL ER (XL) 150 MG PO TB24: 150.0000 mg | ORAL_TABLET | Freq: Every day | ORAL | Status: DC

## 2014-12-23 MED ORDER — SERTRALINE HCL 100 MG PO TABS: 100.0000 mg | ORAL_TABLET | Freq: Every day | ORAL | Status: DC

## 2014-12-23 MED ORDER — BUPROPION HCL ER (XL) 300 MG PO TB24: 300.0000 mg | ORAL_TABLET | Freq: Every day | ORAL | Status: DC

## 2014-12-23 NOTE — Addendum Note (Signed)
Addended by: Verdie ShireBAYNES, Heywood Tokunaga M on: 12/23/2014 01:56 PM   Modules accepted: Orders

## 2014-12-23 NOTE — Telephone Encounter (Addendum)
Rx's sent to the pharmacy by e-script.//AB/CMA 

## 2014-12-23 NOTE — Telephone Encounter (Signed)
Spoke with the pt.//AB/CMA

## 2014-12-23 NOTE — Addendum Note (Signed)
Addended by: Verdie ShireBAYNES, Marquitta Persichetti M on: 12/23/2014 02:29 PM   Modules accepted: Orders

## 2014-12-23 NOTE — Telephone Encounter (Signed)
LMOM @ 1:56 pm @ ([phone]) asking the pt to RTC regarding med refill request.//AB/CMA

## 2015-01-01 ENCOUNTER — Encounter: Payer: Self-pay | Admitting: Physician Assistant

## 2015-01-01 DIAGNOSIS — N3000 Acute cystitis without hematuria: Secondary | ICD-10-CM

## 2015-01-01 MED ORDER — CIPROFLOXACIN HCL 500 MG PO TABS: 500.0000 mg | ORAL_TABLET | Freq: Two times a day (BID) | ORAL | Status: DC

## 2015-01-22 ENCOUNTER — Encounter: Payer: Self-pay | Admitting: Physician Assistant

## 2015-01-22 ENCOUNTER — Ambulatory Visit (INDEPENDENT_AMBULATORY_CARE_PROVIDER_SITE_OTHER): Payer: 59 | Admitting: Physician Assistant

## 2015-01-22 ENCOUNTER — Ambulatory Visit (HOSPITAL_BASED_OUTPATIENT_CLINIC_OR_DEPARTMENT_OTHER)
Admission: RE | Admit: 2015-01-22 | Discharge: 2015-01-22 | Disposition: A | Discharge status: Home / Self Care | Payer: 59 | Source: Ambulatory Visit | Attending: Physician Assistant | Admitting: Physician Assistant

## 2015-01-22 VITALS — BP 118/78 | HR 92 | Temp 98.0°F | Resp 16 | Ht 67.0 in | Wt 167.0 lb

## 2015-01-22 DIAGNOSIS — R0789 Other chest pain: Secondary | ICD-10-CM

## 2015-01-22 DIAGNOSIS — R5383 Other fatigue: Secondary | ICD-10-CM | POA: Insufficient documentation

## 2015-01-22 LAB — COMPREHENSIVE METABOLIC PANEL
ALT: 20 U/L (ref 0–35)
AST: 17 U/L (ref 0–37)
Albumin: 4 g/dL (ref 3.5–5.2)
Alkaline Phosphatase: 68 U/L (ref 39–117)
BUN: 18 mg/dL (ref 6–23)
CO2: 26 mEq/L (ref 19–32)
CREATININE: 0.98 mg/dL (ref 0.40–1.20)
Calcium: 9.7 mg/dL (ref 8.4–10.5)
Chloride: 106 mEq/L (ref 96–112)
GFR: 63.24 mL/min (ref >60.00)
GLUCOSE: 79 mg/dL (ref 70–99)
POTASSIUM: 3.8 meq/L (ref 3.5–5.1)
Sodium: 139 mEq/L (ref 135–145)
Total Bilirubin: 0.6 mg/dL (ref 0.2–1.2)
Total Protein: 7.3 g/dL (ref 6.0–8.3)

## 2015-01-22 LAB — CBC WITH DIFFERENTIAL/PLATELET
Basophils Absolute: 0 10*3/uL (ref 0.0–0.1)
Basophils Relative: 0.6 % (ref 0.0–3.0)
EOS ABS: 0.2 10*3/uL (ref 0.0–0.7)
EOS PCT: 3.6 % (ref 0.0–5.0)
HEMATOCRIT: 40.4 % (ref 36.0–46.0)
HEMOGLOBIN: 14.1 g/dL (ref 12.0–15.0)
Lymphocytes Relative: 34.6 % (ref 12.0–46.0)
Lymphs Abs: 1.8 10*3/uL (ref 0.7–4.0)
MCHC: 35 g/dL (ref 30.0–36.0)
MCV: 93.2 fl (ref 78.0–100.0)
MONO ABS: 0.4 10*3/uL (ref 0.1–1.0)
MONOS PCT: 7.9 % (ref 3.0–12.0)
NEUTROS PCT: 53.3 % (ref 43.0–77.0)
Neutro Abs: 2.8 10*3/uL (ref 1.4–7.7)
Platelets: 295 10*3/uL (ref 150.0–400.0)
RBC: 4.33 Mil/uL (ref 3.87–5.11)
RDW: 12.5 % (ref 11.5–15.5)
WBC: 5.2 10*3/uL (ref 4.0–10.5)

## 2015-01-22 LAB — T4, FREE: Free T4: 0.87 ng/dL (ref 0.60–1.60)

## 2015-01-22 LAB — HIGH SENSITIVITY CRP: CRP, High Sensitivity: 0.6 mg/L (ref 0.000–5.000)

## 2015-01-22 LAB — TSH: TSH: 3.5 u[IU]/mL (ref 0.35–4.50)

## 2015-01-22 LAB — VITAMIN D 25 HYDROXY (VIT D DEFICIENCY, FRACTURES): VITD: 20.73 ng/mL — ABNORMAL LOW (ref 30.00–100.00)

## 2015-01-22 NOTE — Patient Instructions (Signed)
Please go to the lab for blood work. I will call you with your results. Please stop by Imaging on first floor for x-ray. If this all looks normal, I recommend you let a female provider do a comprehensive breast exam, since you do not want a female provider to do this.  We will base treatment on your lab results. Stay active and eat a well-balanced diet.

## 2015-01-22 NOTE — Progress Notes (Signed)
Patient presents to clinic today c/o fatigue over the past 2 months. Does have reported history of hypothyroidism but has had normal TSH previously off of medications.  Denies dietary changes.  Enjoys exercise but feels she has no energy to do this.  Denies increased or new stressors.  Endorses being in good mood overall but has some days with depressed mood. Denies constipation but endorses mild cold intolerance.   Patient also c/o left sided chest/breast pain occuring over the past 2 months.  Denies SOB, diaphoresis, palpitations, LH or dizziness.  Episodes occur every few days and last 10 sec to 1-2 minutes.  States pain is dull and does not radiate.  Denies breast mass/lump.  Is up-to-date on mammogram.  Denies trauma or injury but patient states symptoms feel like a deep muscle strain.  Past Medical History  Diagnosis Date  . Gall stones   . Abdominal pain   . Ovarian cyst   . Nausea & vomiting   . Wears glasses   . Allergy   . Hyperlipidemia   . Depression   . Anxiety   . Bell's palsy 01/12/2013  . Insomnia   . Acid reflux   . Hypothyroidism   . Migraine   . Headache     Current Outpatient Prescriptions on File Prior to Visit  Medication Sig Dispense Refill  . baclofen (LIORESAL) 10 MG tablet Take 1 tablet (10 mg total) by mouth as needed. 30 each 5  . buPROPion (WELLBUTRIN XL) 150 MG 24 hr tablet Take 1 tablet (150 mg total) by mouth daily. 90 tablet 1  . buPROPion (WELLBUTRIN XL) 300 MG 24 hr tablet Take 1 tablet (300 mg total) by mouth daily. 90 tablet 1  . Cholecalciferol (VITAMIN D3) 2000 UNITS TABS Take 2,000 Units by mouth daily.    . Multiple Vitamin (MULTIVITAMIN) tablet Take 1 tablet by mouth daily.    Marland Kitchen omeprazole (PRILOSEC) 20 MG capsule Take 1 capsule (20 mg total) by mouth daily. 90 capsule 4  . Probiotic Product (ALIGN) 4 MG CAPS Take 4 mg by mouth daily as needed.     . sertraline (ZOLOFT) 100 MG tablet Take 1 tablet (100 mg total) by mouth daily. 90 tablet 1    . topiramate (TOPAMAX) 100 MG tablet TAKE 1 TABLET BY MOUTH TWICE A DAY 180 tablet 0  . ciprofloxacin (CIPRO) 500 MG tablet Take 1 tablet (500 mg total) by mouth 2 (two) times daily. (Patient not taking: Reported on 01/22/2015) 6 tablet 0  . Doxepin HCl 3 MG TABS Take 1 tablet by mouth at bedtime as needed.    . loratadine (CLARITIN) 10 MG tablet Take 10 mg by mouth daily as needed.     . methylPREDNIsolone (MEDROL DOSPACK) 4 MG tablet follow package directions (Patient not taking: Reported on 01/22/2015) 21 tablet 0   No current facility-administered medications on file prior to visit.    Allergies  Allergen Reactions  . Bactrim [Sulfamethoxazole-Trimethoprim] Nausea Only    Family History  Problem Relation Age of Onset  . Lung cancer Mother 12    Deceased  . Heart disease Father 69    Deceased  . Diabetes Father   . Heart disease Brother     Deceased  . Diabetes Brother   . Colon cancer Neg Hx   . Esophageal cancer Neg Hx   . Rectal cancer Neg Hx   . Stomach cancer Neg Hx   . Lung cancer Maternal Grandmother     History  Social History  . Marital Status: Married    Spouse Name: N/A  . Number of Children: N/A  . Years of Education: N/A   Social History Main Topics  . Smoking status: Former Smoker    Quit date: 09/25/1998  . Smokeless tobacco: Never Used  . Alcohol Use: No     Comment: rare  . Drug Use: No  . Sexual Activity: Yes    Birth Control/ Protection: Surgical   Other Topics Concern  . None   Social History Narrative   Review of Systems - See HPI.  All other ROS are negative.  BP 118/78 mmHg  Pulse 92  Temp(Src) 98 F (36.7 C) (Oral)  Resp 16  Ht 5\' 7"  (1.702 m)  Wt 167 lb (75.751 kg)  BMI 26.15 kg/m2  SpO2 94%  LMP 10/20/1998  Physical Exam  Constitutional: She is oriented to person, place, and time and well-developed, well-nourished, and in no distress.  HENT:  Head: Normocephalic and atraumatic.  Eyes: Conjunctivae are normal.  Neck:  Neck supple. No thyromegaly present.  Cardiovascular: Normal rate, regular rhythm, normal heart sounds and intact distal pulses.   Pulmonary/Chest: Breath sounds normal. No respiratory distress. She has no wheezes. She has no rales. She exhibits no tenderness.  Musculoskeletal: Normal range of motion.  Neurological: She is alert and oriented to person, place, and time.  Skin: Skin is warm and dry. No rash noted.  Psychiatric: Affect normal.  Vitals reviewed.  Recent Results (from the past 2160 hour(s))  CULTURE, URINE COMPREHENSIVE     Status: None   Collection Time: 11/16/14  5:00 PM  Result Value Ref Range   Culture ESCHERICHIA COLI    Colony Count 5,000 COLONIES/ML    Organism ID, Bacteria ESCHERICHIA COLI       Susceptibility   Escherichia coli -  (no method available)    AMPICILLIN <=2 Sensitive     AMOX/CLAVULANIC <=2 Sensitive     AMPICILLIN/SULBACTAM <=2 Sensitive     PIP/TAZO <=4 Sensitive     IMIPENEM <=0.25 Sensitive     CEFAZOLIN <=4 Sensitive     CEFTRIAXONE <=1 Sensitive     CEFTAZIDIME <=1 Sensitive     CEFEPIME <=1 Sensitive     GENTAMICIN <=1 Sensitive     TOBRAMYCIN <=1 Sensitive     CIPROFLOXACIN <=0.25 Sensitive     LEVOFLOXACIN <=0.12 Sensitive     NITROFURANTOIN <=16 Sensitive     TRIMETH/SULFA <=20 Sensitive     Assessment/Plan: Other fatigue In patient off of thyroid medications.  Will assess TSH, T4, CBC, CMP, hs-CRP to further assess. Will also check Vitamin D level. Encouraged patient to continue current medication regimen and to stay active.   Atypical chest pain EKG reveals NSR, unchanged from last EKG. Will obtain CXR.  Pain localized to deep breast tissue. Patient refuses exam as I am female provider.  Supportive measures discussed.  Patient to schedule appointment with female provider for breast examination.  May bump up mammogram this year to further assess.

## 2015-01-22 NOTE — Assessment & Plan Note (Signed)
EKG reveals NSR, unchanged from last EKG. Will obtain CXR.  Pain localized to deep breast tissue. Patient refuses exam as I am female provider.  Supportive measures discussed.  Patient to schedule appointment with female provider for breast examination.  May bump up mammogram this year to further assess.

## 2015-01-22 NOTE — Progress Notes (Signed)
Pre visit review using our clinic review tool, if applicable. No additional management support is needed unless otherwise documented below in the visit note. 

## 2015-01-22 NOTE — Assessment & Plan Note (Signed)
In patient off of thyroid medications.  Will assess TSH, T4, CBC, CMP, hs-CRP to further assess. Will also check Vitamin D level. Encouraged patient to continue current medication regimen and to stay active.

## 2015-01-26 ENCOUNTER — Encounter: Payer: Self-pay | Admitting: Physician Assistant

## 2015-01-29 ENCOUNTER — Telehealth: Payer: Self-pay | Admitting: Physician Assistant

## 2015-01-29 NOTE — Telephone Encounter (Signed)
Relation to pt: self  Call back number: [phone] [id number] [place] - [address] [phone] (Phone) [phone] (Fax)         Reason for call:  Pt requesting a vitamin D rx (pt does not know the name) please advise

## 2015-01-30 MED ORDER — VITAMIN D (ERGOCALCIFEROL) 1.25 MG (50000 UNIT) PO CAPS: 50000.0000 [IU] | ORAL_CAPSULE | ORAL | Status: DC

## 2015-01-30 NOTE — Telephone Encounter (Signed)
Done

## 2015-02-15 ENCOUNTER — Other Ambulatory Visit: Payer: Self-pay | Admitting: Physician Assistant

## 2015-02-16 ENCOUNTER — Other Ambulatory Visit: Payer: Self-pay | Admitting: Physician Assistant

## 2015-05-11 ENCOUNTER — Encounter: Payer: Self-pay | Admitting: Physician Assistant

## 2015-05-11 MED ORDER — TOPIRAMATE 100 MG PO TABS: 100.0000 mg | ORAL_TABLET | Freq: Two times a day (BID) | ORAL | Status: DC

## 2015-05-11 MED ORDER — SERTRALINE HCL 100 MG PO TABS: ORAL_TABLET | ORAL | Status: DC

## 2015-05-11 MED ORDER — BUPROPION HCL ER (XL) 150 MG PO TB24: 450.0000 mg | ORAL_TABLET | Freq: Every day | ORAL | Status: DC

## 2015-08-19 ENCOUNTER — Other Ambulatory Visit: Payer: Self-pay | Admitting: Physician Assistant

## 2015-10-13 ENCOUNTER — Other Ambulatory Visit: Payer: Self-pay | Admitting: Physician Assistant

## 2015-12-17 ENCOUNTER — Ambulatory Visit (INDEPENDENT_AMBULATORY_CARE_PROVIDER_SITE_OTHER): Payer: 59 | Admitting: Physician Assistant

## 2015-12-17 ENCOUNTER — Encounter: Payer: Self-pay | Admitting: Physician Assistant

## 2015-12-17 VITALS — BP 110/78 | HR 113 | Temp 98.3°F | Ht 67.0 in | Wt 192.8 lb

## 2015-12-17 DIAGNOSIS — J019 Acute sinusitis, unspecified: Secondary | ICD-10-CM | POA: Diagnosis not present

## 2015-12-17 DIAGNOSIS — G43809 Other migraine, not intractable, without status migrainosus: Secondary | ICD-10-CM | POA: Diagnosis not present

## 2015-12-17 DIAGNOSIS — B9689 Other specified bacterial agents as the cause of diseases classified elsewhere: Secondary | ICD-10-CM | POA: Insufficient documentation

## 2015-12-17 MED ORDER — FLUTICASONE PROPIONATE 50 MCG/ACT NA SUSP: 2.0000 | Freq: Every day | NASAL | Status: DC

## 2015-12-17 MED ORDER — BENZONATATE 100 MG PO CAPS: 100.0000 mg | ORAL_CAPSULE | Freq: Two times a day (BID) | ORAL | Status: DC | PRN

## 2015-12-17 MED ORDER — BACLOFEN 10 MG PO TABS: 10.0000 mg | ORAL_TABLET | ORAL | Status: DC | PRN

## 2015-12-17 MED ORDER — DOXYCYCLINE HYCLATE 100 MG PO CAPS: 100.0000 mg | ORAL_CAPSULE | Freq: Two times a day (BID) | ORAL | Status: DC

## 2015-12-17 MED ORDER — METHYLPREDNISOLONE ACETATE 80 MG/ML IJ SUSP
40.0000 mg | Freq: Once | INTRAMUSCULAR | Status: AC
  Administered 2015-12-17: 40 mg via INTRAMUSCULAR

## 2015-12-17 NOTE — Patient Instructions (Signed)
Please take antibiotic as directed.  Increase fluid intake.  Use Saline nasal spray.  Take a daily multivitamin. Use Flonase and Tessalon as directed.  Place a humidifier in the bedroom.  Please call or return clinic if symptoms are not improving.  Sinusitis Sinusitis is redness, soreness, and swelling (inflammation) of the paranasal sinuses. Paranasal sinuses are air pockets within the bones of your face (beneath the eyes, the middle of the forehead, or above the eyes). In healthy paranasal sinuses, mucus is able to drain out, and air is able to circulate through them by way of your nose. However, when your paranasal sinuses are inflamed, mucus and air can become trapped. This can allow bacteria and other germs to grow and cause infection. Sinusitis can develop quickly and last only a short time (acute) or continue over a long period (chronic). Sinusitis that lasts for more than 12 weeks is considered chronic.  CAUSES  Causes of sinusitis include:  Allergies.  Structural abnormalities, such as displacement of the cartilage that separates your nostrils (deviated septum), which can decrease the air flow through your nose and sinuses and affect sinus drainage.  Functional abnormalities, such as when the small hairs (cilia) that line your sinuses and help remove mucus do not work properly or are not present. SYMPTOMS  Symptoms of acute and chronic sinusitis are the same. The primary symptoms are pain and pressure around the affected sinuses. Other symptoms include:  Upper toothache.  Earache.  Headache.  Bad breath.  Decreased sense of smell and taste.  A cough, which worsens when you are lying flat.  Fatigue.  Fever.  Thick drainage from your nose, which often is green and may contain pus (purulent).  Swelling and warmth over the affected sinuses. DIAGNOSIS  Your caregiver will perform a physical exam. During the exam, your caregiver may:  Look in your nose for signs of abnormal  growths in your nostrils (nasal polyps).  Tap over the affected sinus to check for signs of infection.  View the inside of your sinuses (endoscopy) with a special imaging device with a light attached (endoscope), which is inserted into your sinuses. If your caregiver suspects that you have chronic sinusitis, one or more of the following tests may be recommended:  Allergy tests.  Nasal culture A sample of mucus is taken from your nose and sent to a lab and screened for bacteria.  Nasal cytology A sample of mucus is taken from your nose and examined by your caregiver to determine if your sinusitis is related to an allergy. TREATMENT  Most cases of acute sinusitis are related to a viral infection and will resolve on their own within 10 days. Sometimes medicines are prescribed to help relieve symptoms (pain medicine, decongestants, nasal steroid sprays, or saline sprays).  However, for sinusitis related to a bacterial infection, your caregiver will prescribe antibiotic medicines. These are medicines that will help kill the bacteria causing the infection.  Rarely, sinusitis is caused by a fungal infection. In theses cases, your caregiver will prescribe antifungal medicine. For some cases of chronic sinusitis, surgery is needed. Generally, these are cases in which sinusitis recurs more than 3 times per year, despite other treatments. HOME CARE INSTRUCTIONS   Drink plenty of water. Water helps thin the mucus so your sinuses can drain more easily.  Use a humidifier.  Inhale steam 3 to 4 times a day (for example, sit in the bathroom with the shower running).  Apply a warm, moist washcloth to your face   3 to 4 times a day, or as directed by your caregiver.  Use saline nasal sprays to help moisten and clean your sinuses.  Take over-the-counter or prescription medicines for pain, discomfort, or fever only as directed by your caregiver. SEEK IMMEDIATE MEDICAL CARE IF:  You have increasing pain or  severe headaches.  You have nausea, vomiting, or drowsiness.  You have swelling around your face.  You have vision problems.  You have a stiff neck.  You have difficulty breathing. MAKE SURE YOU:   Understand these instructions.  Will watch your condition.  Will get help right away if you are not doing well or get worse. Document Released: 09/11/2005 Document Revised: 12/04/2011 Document Reviewed: 09/26/2011 [employer] Patient Information 2014 [employer].   

## 2015-12-17 NOTE — Assessment & Plan Note (Signed)
Rx Doxycycline.  Increase fluids.  Rest.  Saline nasal spray.  Probiotic.  Mucinex as directed.  Humidifier in bedroom. Rx Flonase and Tessalon. Im Depo medrol given.  Call or return to clinic if symptoms are not improving.

## 2015-12-17 NOTE — Progress Notes (Signed)
Pre visit review using our clinic review tool, if applicable. No additional management support is needed unless otherwise documented below in the visit note. 

## 2015-12-17 NOTE — Progress Notes (Signed)
Patient presents to clinic today c/o continue sinus pressure, sinus pain, facial pain and fatigue s/p treatment for [facility] with Augmentin and prednisone. Endorses 3 days of aches chills. Denies fever, but notes mild chest congestion and cough.  Past Medical History  Diagnosis Date  . Gall stones   . Abdominal pain   . Ovarian cyst   . Nausea & vomiting   . Wears glasses   . Allergy   . Hyperlipidemia   . Depression   . Anxiety   . Bell's palsy 01/12/2013  . Insomnia   . Acid reflux   . Hypothyroidism   . Migraine   . Headache     Current Outpatient Prescriptions on File Prior to Visit  Medication Sig Dispense Refill  . buPROPion (WELLBUTRIN XL) 150 MG 24 hr tablet TAKE 3 TABLETS (450 MG TOTAL) BY MOUTH DAILY. 270 tablet 0  . Cholecalciferol (VITAMIN D3) 2000 UNITS TABS Take 1,000 Units by mouth daily.     . Cyanocobalamin (B-12) 3000 MCG CAPS Take by mouth daily.    . Multiple Vitamin (MULTIVITAMIN) tablet Take 1 tablet by mouth daily.    Marland Kitchen. omeprazole (PRILOSEC) 20 MG capsule Take 1 capsule (20 mg total) by mouth daily. 90 capsule 4  . Probiotic Product (ALIGN) 4 MG CAPS Take 4 mg by mouth daily as needed.     . sertraline (ZOLOFT) 100 MG tablet TAKE 1 TABLET (100 MG TOTAL) BY MOUTH DAILY. 90 tablet 0  . topiramate (TOPAMAX) 100 MG tablet Take 1 tablet (100 mg total) by mouth 2 (two) times daily. 180 tablet 0   No current facility-administered medications on file prior to visit.    Allergies  Allergen Reactions  . Bactrim [Sulfamethoxazole-Trimethoprim] Nausea Only    Family History  Problem Relation Age of Onset  . Lung cancer Mother 2640    Deceased  . Heart disease Father [id number]    Deceased  . Diabetes Father   . Heart disease Brother     Deceased  . Diabetes Brother   . Colon cancer Neg Hx   . Esophageal cancer Neg Hx   . Rectal cancer Neg Hx   . Stomach cancer Neg Hx   . Lung cancer Maternal Grandmother     Social History   Social  History  . Marital Status: Married    Spouse Name: N/A  . Number of Children: N/A  . Years of Education: N/A   Social History Main Topics  . Smoking status: Former Smoker    Quit date: 09/25/1998  . Smokeless tobacco: Never Used  . Alcohol Use: No     Comment: rare  . Drug Use: No  . Sexual Activity: Yes    Birth Control/ Protection: Surgical   Other Topics Concern  . None   Social History Narrative    Review of Systems - See HPI.  All other ROS are negative.  BP 110/78 mmHg  Pulse 113  Temp(Src) 98.3 F (36.8 C) (Oral)  Ht 5\' 7"  (1.702 m)  Wt 192 lb 12.8 oz (87.454 kg)  BMI 30.19 kg/m2  SpO2 96%  LMP 10/20/1998  Physical Exam  Constitutional: She is oriented to person, place, and time and well-developed, well-nourished, and in no distress.  HENT:  Head: Normocephalic and atraumatic.  Right Ear: Tympanic membrane normal.  Left Ear: Tympanic membrane normal.  Nose: Mucosal edema and rhinorrhea present. Right sinus exhibits frontal sinus tenderness. Left sinus exhibits frontal sinus tenderness.  Mouth/Throat: Uvula  is midline, oropharynx is clear and moist and mucous membranes are normal.  Eyes: Conjunctivae are normal.  Neck: Neck supple.  Cardiovascular: Normal rate, regular rhythm, normal heart sounds and intact distal pulses.   Pulmonary/Chest: Effort normal and breath sounds normal. No respiratory distress. She has no wheezes. She has no rales. She exhibits no tenderness.  Neurological: She is alert and oriented to person, place, and time.  Vitals reviewed.   No results found for this or any previous visit (from the past 2160 hour(s)).  Assessment/Plan: Acute bacterial sinusitis Rx Doxycycline.  Increase fluids.  Rest.  Saline nasal spray.  Probiotic.  Mucinex as directed.  Humidifier in bedroom. Rx Flonase and Tessalon. Im Depo medrol given.  Call or return to clinic if symptoms are not improving.

## 2015-12-19 ENCOUNTER — Other Ambulatory Visit: Payer: Self-pay | Admitting: Physician Assistant

## 2015-12-21 ENCOUNTER — Encounter: Payer: Self-pay | Admitting: Physician Assistant

## 2015-12-21 MED ORDER — AMOXICILLIN-POT CLAVULANATE 875-125 MG PO TABS: 1.0000 | ORAL_TABLET | Freq: Two times a day (BID) | ORAL | Status: DC

## 2015-12-26 ENCOUNTER — Encounter: Payer: Self-pay | Admitting: Physician Assistant

## 2015-12-28 MED ORDER — METHYLPREDNISOLONE 4 MG PO TBPK: ORAL_TABLET | ORAL | Status: DC

## 2015-12-28 NOTE — Addendum Note (Signed)
Addended by: Marcelline MatesMARTIN, Amala Petion on: 12/28/2015 10:00 AM   Modules accepted: Orders

## 2016-01-17 ENCOUNTER — Other Ambulatory Visit: Payer: Self-pay | Admitting: Physician Assistant

## 2016-02-21 ENCOUNTER — Encounter: Payer: Self-pay | Admitting: Physician Assistant

## 2016-02-21 DIAGNOSIS — R21 Rash and other nonspecific skin eruption: Secondary | ICD-10-CM

## 2016-04-26 ENCOUNTER — Ambulatory Visit: Payer: 59 | Admitting: Physician Assistant

## 2016-04-26 ENCOUNTER — Telehealth: Payer: Self-pay | Admitting: Physician Assistant

## 2016-04-26 NOTE — Telephone Encounter (Signed)
No charge. 

## 2016-04-26 NOTE — Telephone Encounter (Signed)
Patient LVM at 9:30am cancelling 6:15pm appointment due to work conflict. Charge or no charge

## 2016-05-24 ENCOUNTER — Other Ambulatory Visit: Payer: Self-pay | Admitting: Physician Assistant

## 2016-05-24 MED ORDER — TOPIRAMATE 100 MG PO TABS: 100.0000 mg | ORAL_TABLET | Freq: Two times a day (BID) | ORAL | 1 refills | Status: DC

## 2016-06-20 ENCOUNTER — Other Ambulatory Visit: Payer: Self-pay | Admitting: Physician Assistant

## 2016-06-20 DIAGNOSIS — Z1231 Encounter for screening mammogram for malignant neoplasm of breast: Secondary | ICD-10-CM

## 2016-06-23 ENCOUNTER — Ambulatory Visit: Payer: 59

## 2016-06-23 ENCOUNTER — Ambulatory Visit
Admission: RE | Admit: 2016-06-23 | Discharge: 2016-06-23 | Disposition: A | Discharge status: Home / Self Care | Payer: 59 | Source: Ambulatory Visit | Attending: Physician Assistant | Admitting: Physician Assistant

## 2016-06-23 ENCOUNTER — Ambulatory Visit (INDEPENDENT_AMBULATORY_CARE_PROVIDER_SITE_OTHER): Payer: 59 | Admitting: Physician Assistant

## 2016-06-23 ENCOUNTER — Encounter: Payer: Self-pay | Admitting: Physician Assistant

## 2016-06-23 VITALS — BP 104/60 | HR 77 | Temp 98.2°F | Resp 16 | Ht 67.0 in | Wt 184.2 lb

## 2016-06-23 DIAGNOSIS — Z0000 Encounter for general adult medical examination without abnormal findings: Secondary | ICD-10-CM | POA: Diagnosis not present

## 2016-06-23 DIAGNOSIS — Z0289 Encounter for other administrative examinations: Secondary | ICD-10-CM

## 2016-06-23 DIAGNOSIS — Z1231 Encounter for screening mammogram for malignant neoplasm of breast: Secondary | ICD-10-CM

## 2016-06-23 DIAGNOSIS — Z7689 Persons encountering health services in other specified circumstances: Secondary | ICD-10-CM

## 2016-06-23 LAB — CBC
HCT: 40 % (ref 36.0–46.0)
Hemoglobin: 14 g/dL (ref 12.0–15.0)
MCHC: 35.1 g/dL (ref 30.0–36.0)
MCV: 92.4 fl (ref 78.0–100.0)
Platelets: 323 10*3/uL (ref 150.0–400.0)
RBC: 4.33 Mil/uL (ref 3.87–5.11)
RDW: 12.5 % (ref 11.5–15.5)
WBC: 5.5 10*3/uL (ref 4.0–10.5)

## 2016-06-23 LAB — TSH: TSH: 3.23 u[IU]/mL (ref 0.35–4.50)

## 2016-06-23 LAB — COMPREHENSIVE METABOLIC PANEL
ALK PHOS: 77 U/L (ref 39–117)
ALT: 17 U/L (ref 0–35)
AST: 18 U/L (ref 0–37)
Albumin: 3.8 g/dL (ref 3.5–5.2)
BILIRUBIN TOTAL: 0.5 mg/dL (ref 0.2–1.2)
BUN: 16 mg/dL (ref 6–23)
CO2: 27 mEq/L (ref 19–32)
Calcium: 9.2 mg/dL (ref 8.4–10.5)
Chloride: 108 mEq/L (ref 96–112)
Creatinine, Ser: 1.15 mg/dL (ref 0.40–1.20)
GFR: 52.3 mL/min — ABNORMAL LOW (ref >60.00)
GLUCOSE: 86 mg/dL (ref 70–99)
Potassium: 3.8 mEq/L (ref 3.5–5.1)
SODIUM: 141 meq/L (ref 135–145)
TOTAL PROTEIN: 7.4 g/dL (ref 6.0–8.3)

## 2016-06-23 LAB — URINALYSIS, ROUTINE W REFLEX MICROSCOPIC
Bilirubin Urine: NEGATIVE
Hgb urine dipstick: NEGATIVE
Ketones, ur: NEGATIVE
Leukocytes, UA: NEGATIVE
Nitrite: NEGATIVE
PH: 6 (ref 5.0–8.0)
SPECIFIC GRAVITY, URINE: 1.025 (ref 1.000–1.030)
Total Protein, Urine: NEGATIVE
UROBILINOGEN UA: 0.2 (ref 0.0–1.0)
Urine Glucose: NEGATIVE
WBC, UA: NONE SEEN (ref 0–?)

## 2016-06-23 LAB — LIPID PANEL
CHOL/HDL RATIO: 4
Cholesterol: 237 mg/dL — ABNORMAL HIGH (ref 0–200)
HDL: 59.5 mg/dL (ref >39.00)
LDL CALC: 154 mg/dL — AB (ref 0–99)
NonHDL: 177.22
TRIGLYCERIDES: 118 mg/dL (ref 0.0–149.0)
VLDL: 23.6 mg/dL (ref 0.0–40.0)

## 2016-06-23 LAB — HEMOGLOBIN A1C: Hgb A1c MFr Bld: 5.1 % (ref 4.6–6.5)

## 2016-06-23 MED ORDER — TRAZODONE HCL 50 MG PO TABS: 25.0000 mg | ORAL_TABLET | Freq: Every day | ORAL | 1 refills | Status: DC

## 2016-06-23 NOTE — Progress Notes (Signed)
Pre visit review using our clinic review tool, if applicable. No additional management support is needed unless otherwise documented below in the visit note/SLS  

## 2016-06-23 NOTE — Progress Notes (Signed)
Patient presents to clinic today for annual exam.  Patient is fasting for labs. Body mass index is 28.86 kg/m. Decreased intake for fruits/vegetables. Is more of a "meat and potatoes girl". Is taking a Biotin supplement daily. [employer] daily as well. Has joined a gym 2 weeks ago. Twice weekly.    Acute Concerns: Denies acute concerns today.  Chronic Issues: Anxiety and Depression -- Wellbutrin XL 450 mg daily. Also taking sertraline as directed. Endorses great mood overall. Is having some issue with sleep as brain having hard times shutting off. Denies SI/HI.   Health Maintenance:  Immunizations --Declines flu shot.  Colonoscopy -- up-to-date. Mammogram --  Overdue. Is scheduled today at [facility]  PAP -- Overdue. S/p hysterectomy.   Past Medical History:  Diagnosis Date  . Abdominal pain   . Acid reflux   . Allergy   . Anxiety   . Bell's palsy 01/12/2013  . Depression   . Gall stones   . Headache   . Hyperlipidemia   . Hypothyroidism   . Insomnia   . Migraine   . Nausea & vomiting   . Ovarian cyst   . Wears glasses     Past Surgical History:  Procedure Laterality Date  . ABDOMINAL HYSTERECTOMY    . CHOLECYSTECTOMY    . LAPAROSCOPIC ENDOMETRIOSIS FULGURATION    . WISDOM TOOTH EXTRACTION      Current Outpatient Prescriptions on File Prior to Visit  Medication Sig Dispense Refill  . baclofen (LIORESAL) 10 MG tablet Take 1 tablet (10 mg total) by mouth as needed. 30 each 5  . buPROPion (WELLBUTRIN XL) 150 MG 24 hr tablet TAKE 3 TABLETS (450 MG TOTAL) BY MOUTH DAILY. 270 tablet 1  . Cholecalciferol (VITAMIN D3) 2000 UNITS TABS Take 2,000 Units by mouth at bedtime.     . Cyanocobalamin (B-12) 3000 MCG CAPS Take by mouth daily.    . Multiple Vitamin (MULTIVITAMIN) tablet Take 1 tablet by mouth daily.    Marland Kitchen omeprazole (PRILOSEC) 20 MG capsule Take 1 capsule (20 mg total) by mouth daily. 90 capsule 4  . Probiotic Product (ALIGN) 4 MG CAPS Take 4 mg by mouth daily as  needed.     . sertraline (ZOLOFT) 100 MG tablet TAKE 1 TABLET (100 MG TOTAL) BY MOUTH DAILY. 90 tablet 1  . topiramate (TOPAMAX) 100 MG tablet Take 1 tablet (100 mg total) by mouth 2 (two) times daily. 180 tablet 1   No current facility-administered medications on file prior to visit.     Allergies  Allergen Reactions  . Bactrim [Sulfamethoxazole-Trimethoprim] Nausea Only    Family History  Problem Relation Age of Onset  . Lung cancer Mother 59    Deceased  . Heart disease Father 65    Deceased  . Diabetes Father   . Heart disease Brother     Deceased  . Diabetes Brother   . Colon cancer Neg Hx   . Esophageal cancer Neg Hx   . Rectal cancer Neg Hx   . Stomach cancer Neg Hx   . Lung cancer Maternal Grandmother     Social History   Social History  . Marital status: Married    Spouse name: N/A  . Number of children: N/A  . Years of education: N/A   Occupational History  . Not on file.   Social History Main Topics  . Smoking status: Former Smoker    Quit date: 09/25/1998  . Smokeless tobacco: Never Used  . Alcohol  use No     Comment: rare  . Drug use: No  . Sexual activity: Yes    Birth control/ protection: Surgical   Other Topics Concern  . Not on file   Social History Narrative  . No narrative on file    Review of Systems  Constitutional: Negative for fever and weight loss.  HENT: Negative for ear discharge, ear pain, hearing loss and tinnitus.   Eyes: Negative for blurred vision, double vision, photophobia and pain.  Respiratory: Negative for cough and shortness of breath.   Cardiovascular: Negative for chest pain and palpitations.  Gastrointestinal: Negative for abdominal pain, blood in stool, constipation, diarrhea, heartburn, melena, nausea and vomiting.  Genitourinary: Negative for dysuria, flank pain, frequency, hematuria and urgency.  Musculoskeletal: Negative for falls.  Neurological: Negative for dizziness, loss of consciousness and headaches.    Endo/Heme/Allergies: Negative for environmental allergies.  Psychiatric/Behavioral: Negative for depression, hallucinations, substance abuse and suicidal ideas. The patient has insomnia. The patient is not nervous/anxious.     BP 104/60 (BP Location: Left Arm, Patient Position: Sitting, Cuff Size: Large)   Pulse 77   Temp 98.2 F (36.8 C) (Oral)   Resp 16   Ht 5\' 7"  (1.702 m)   Wt 184 lb 4 oz (83.6 kg)   LMP 10/20/1998   SpO2 98%   BMI 28.86 kg/m   Physical Exam  Constitutional: She is oriented to person, place, and time and well-developed, well-nourished, and in no distress.  HENT:  Head: Normocephalic and atraumatic.  Right Ear: Tympanic membrane, external ear and ear canal normal.  Left Ear: Tympanic membrane, external ear and ear canal normal.  Nose: Nose normal. No mucosal edema.  Mouth/Throat: Uvula is midline, oropharynx is clear and moist and mucous membranes are normal. No oropharyngeal exudate or posterior oropharyngeal erythema.  Eyes: Conjunctivae are normal. Pupils are equal, round, and reactive to light.  Neck: Neck supple. No thyromegaly present.  Cardiovascular: Normal rate, regular rhythm, normal heart sounds and intact distal pulses.   Pulmonary/Chest: Effort normal and breath sounds normal. No respiratory distress. She has no wheezes. She has no rales.  Abdominal: Soft. Bowel sounds are normal. She exhibits no distension and no mass. There is no tenderness. There is no rebound and no guarding.  Lymphadenopathy:    She has no cervical adenopathy.  Neurological: She is alert and oriented to person, place, and time. No cranial nerve deficit.  Skin: Skin is warm and dry. No rash noted.  Psychiatric: Affect normal.  Vitals reviewed.  Assessment/Plan: Visit for preventive health examination Depression screen negative. Health Maintenance reviewed -- Mammogram to be obtained today. Referral to gynecology placed for routine care per patient request. Declines flu  shot. Colonoscopy up-to-date.  Preventive schedule discussed and handout given in AVS. Will obtain fasting labs today.     Piedad ClimesMartin, Jamesmichael Shadd Cody, PA-C

## 2016-06-23 NOTE — Patient Instructions (Signed)
Please go to the lab for blood work.   Our office will call you with your results unless you have chosen to receive results via MyChart.  If your blood work is normal we will follow-up each year for physicals and as scheduled for chronic medical problems.  If anything is abnormal we will treat accordingly and get you in for a follow-up.  Please start the Trazodone in the evening. Let me know how your mood is doing.  If anything worsens, stop the medication and come see me immediately.

## 2016-06-24 NOTE — Assessment & Plan Note (Signed)
Depression screen negative. Health Maintenance reviewed -- Mammogram to be obtained today. Referral to gynecology placed for routine care per patient request. Declines flu shot. Colonoscopy up-to-date.  Preventive schedule discussed and handout given in AVS. Will obtain fasting labs today.

## 2016-07-03 ENCOUNTER — Encounter: Payer: Self-pay | Admitting: Physician Assistant

## 2016-07-17 ENCOUNTER — Other Ambulatory Visit: Payer: Self-pay | Admitting: Physician Assistant

## 2016-07-17 NOTE — Telephone Encounter (Signed)
Last Filled: 12/19/15, #270x1 Last OV: 06/23/16 [facility]SL[facility]

## 2016-08-25 ENCOUNTER — Other Ambulatory Visit: Payer: Self-pay | Admitting: Physician Assistant

## 2016-08-25 NOTE — Telephone Encounter (Signed)
Refill sent per [facility] refill protocol/SLS  

## 2016-12-20 ENCOUNTER — Ambulatory Visit (INDEPENDENT_AMBULATORY_CARE_PROVIDER_SITE_OTHER): Payer: 59 | Admitting: Physician Assistant

## 2016-12-20 ENCOUNTER — Ambulatory Visit (INDEPENDENT_AMBULATORY_CARE_PROVIDER_SITE_OTHER): Payer: 59

## 2016-12-20 ENCOUNTER — Encounter: Payer: Self-pay | Admitting: Physician Assistant

## 2016-12-20 VITALS — BP 112/82 | HR 89 | Temp 98.5°F | Resp 14 | Ht 67.0 in | Wt 180.0 lb

## 2016-12-20 DIAGNOSIS — M542 Cervicalgia: Secondary | ICD-10-CM

## 2016-12-20 DIAGNOSIS — H18722 Corneal staphyloma, left eye: Secondary | ICD-10-CM

## 2016-12-20 DIAGNOSIS — G44229 Chronic tension-type headache, not intractable: Secondary | ICD-10-CM | POA: Diagnosis not present

## 2016-12-20 DIAGNOSIS — G479 Sleep disorder, unspecified: Secondary | ICD-10-CM | POA: Insufficient documentation

## 2016-12-20 MED ORDER — KETOROLAC TROMETHAMINE 60 MG/2ML IM SOLN
60.0000 mg | Freq: Once | INTRAMUSCULAR | Status: AC
  Administered 2016-12-20: 30 mg via INTRAMUSCULAR

## 2016-12-20 MED ORDER — TIZANIDINE HCL 4 MG PO CAPS: 4.0000 mg | ORAL_CAPSULE | Freq: Three times a day (TID) | ORAL | 1 refills | Status: DC | PRN

## 2016-12-20 NOTE — Progress Notes (Signed)
Pre visit review using our clinic review tool, if applicable. No additional management support is needed unless otherwise documented below in the visit note. 

## 2016-12-20 NOTE — Patient Instructions (Signed)
Please start the Tizanidine as directed -- take at night. Can take up to three times daily if needed. Avoid driving while on this medication. Tylenol or Excedrin as needed for acute headache. I am setting you up with Neurology for further assessment.  I am concerned there is a disc issue contributing to some of the symptoms. [facility] I will call you with results. We will proceed with further assessment based on findings.   Follow-up in 2 weeks.

## 2016-12-20 NOTE — Progress Notes (Signed)
Patient presents to clinic today c/o for ER follow-up of headaches. Patient with history of chronic migraines, currently on Topamax 100 mg BID, presented to ER on 11/18/16 with significant headache not alleviated by her normal regimen.. Patient endorses taking multiple baclofen without relief. Workup included stable labs and CT Head that was negative for stroke but with incidental findings of potential staphylomas. Ophthalmology follow-up was recommended.  Patient has seen Opthalmology, appointment on 12/18/16. Notes in Care Everywhere reviewed. Noted patient needed dilated eye examination but refused. Non-dilated examination was normal but specialist noted that it was inconclusive as it was hard to assess posterior abnormalities. They recommended she follow-up with her regular ophthalmologist. Patient endorses having appointment scheduled.  Since then, patient has noted increase in frequency of her headaches. Notes new characteristic in headaches, described as tension in her neck and shoulders. Headaches are now sometimes posterior and feel like a grip around her head. Does note some increased stressors recently. Denies nausea or vomiting. Denies photophobia or phonophobia that usually accompanies her migraines. Denies neck stiffness or rigidity but does note intermittent neck pain that travels into her upper extremities.   Past Medical History:  Diagnosis Date  . Abdominal pain   . Acid reflux   . Allergy   . Anxiety   . Bell's palsy 01/12/2013  . Depression   . Gall stones   . Headache   . Hyperlipidemia   . Hypothyroidism   . Insomnia   . Migraine   . Nausea & vomiting   . Ovarian cyst   . Wears glasses     Current Outpatient Prescriptions on File Prior to Visit  Medication Sig Dispense Refill  . baclofen (LIORESAL) 10 MG tablet Take 1 tablet (10 mg total) by mouth as needed. 30 each 5  . Bioflavonoid Products (ESTER-C) TABS Take 1,000 each by mouth daily.    . Biotin 1610910000 MCG TABS  Take by mouth daily.    Marland Kitchen. buPROPion (WELLBUTRIN XL) 150 MG 24 hr tablet TAKE 3 TABLETS (450 MG TOTAL) BY MOUTH DAILY. 270 tablet 1  . Cholecalciferol (VITAMIN D3) 2000 UNITS TABS Take 5,000 Units by mouth at bedtime.     . Cyanocobalamin (B-12) 3000 MCG CAPS Take by mouth daily.    Marland Kitchen. loratadine (CLARITIN) 10 MG tablet Take by mouth daily.    . Multiple Vitamin (MULTIVITAMIN) tablet Take 1 tablet by mouth daily.    Marland Kitchen. omeprazole (PRILOSEC) 20 MG capsule Take 1 capsule (20 mg total) by mouth daily. 90 capsule 4  . Probiotic Product (ALIGN) 4 MG CAPS Take 4 mg by mouth daily as needed.     . sertraline (ZOLOFT) 100 MG tablet TAKE 1 TABLET (100 MG TOTAL) BY MOUTH DAILY. 90 tablet 0  . topiramate (TOPAMAX) 100 MG tablet Take 1 tablet (100 mg total) by mouth 2 (two) times daily. 180 tablet 1   No current facility-administered medications on file prior to visit.     Allergies  Allergen Reactions  . Bactrim [Sulfamethoxazole-Trimethoprim] Nausea Only    Family History  Problem Relation Age of Onset  . Lung cancer Mother 6140    Deceased  . Heart disease Father [id number]    Deceased  . Diabetes Father   . Heart disease Brother     Deceased  . Diabetes Brother   . Colon cancer Neg Hx   . Esophageal cancer Neg Hx   . Rectal cancer Neg Hx   . Stomach cancer Neg Hx   . Lung  cancer Maternal Grandmother     Social History   Social History  . Marital status: Married    Spouse name: N/A  . Number of children: N/A  . Years of education: N/A   Social History Main Topics  . Smoking status: Former Smoker    Quit date: 09/25/1998  . Smokeless tobacco: Never Used  . Alcohol use No     Comment: rare  . Drug use: No  . Sexual activity: Yes    Birth control/ protection: Surgical   Other Topics Concern  . Not on file   Social History Narrative  . No narrative on file    Review of Systems - See HPI.  All other ROS are negative.  LMP 10/20/1998   Physical Exam  Constitutional: She is  oriented to person, place, and time and well-developed, well-nourished, and in no distress.  HENT:  Head: Normocephalic and atraumatic.  Eyes: Conjunctivae are normal.  Neck: Neck supple.  Cardiovascular: Normal rate, regular rhythm, normal heart sounds and intact distal pulses.   Pulmonary/Chest: Effort normal and breath sounds normal. No respiratory distress. She has no wheezes. She has no rales. She exhibits no tenderness.  Neurological: She is alert and oriented to person, place, and time. No cranial nerve deficit.  Skin: Skin is warm and dry. No rash noted.  Psychiatric: Affect normal.  Vitals reviewed.  Assessment/Plan: 1. Chronic tension-type headache, not intractable Toradol given in office today for acute headache abortion. Exam unremarkable. Will start Tizanidine. Will consider increase of Topamax if symptoms not improving. Patient to follow-up with her ophthalmologist as scheduled. Referral back to Neurology placed. - ketorolac (TORADOL) injection 60 mg; Inject 2 mLs (60 mg total) into the muscle once.  2. Cervical pain (neck) Likely contributing to headaches. Will obtain x-ray cervical spine. May need MRI. Referral to Neurology placed.  - DG Cervical Spine Complete; Future - Ambulatory referral to Neurology - ketorolac (TORADOL) injection 60 mg; Inject 2 mLs (60 mg total) into the muscle once.  3. Staphyloma corneae, left CT finding. Patient has seen ophthalmology but refused dilated examination that she needs. Patient has appt scheduled with her regular provider for further assessment. Encouraged her to follow their recommendations and allow them to do appropriate examination   Piedad Climes, PA-C

## 2016-12-21 ENCOUNTER — Encounter: Payer: Self-pay | Admitting: Physician Assistant

## 2016-12-21 ENCOUNTER — Other Ambulatory Visit: Payer: Self-pay | Admitting: Physician Assistant

## 2016-12-21 DIAGNOSIS — M5412 Radiculopathy, cervical region: Secondary | ICD-10-CM

## 2016-12-22 ENCOUNTER — Other Ambulatory Visit: Payer: Self-pay | Admitting: Physician Assistant

## 2016-12-22 MED ORDER — TIZANIDINE HCL 4 MG PO TABS: 2.0000 mg | ORAL_TABLET | Freq: Three times a day (TID) | ORAL | 1 refills | Status: DC

## 2016-12-27 ENCOUNTER — Other Ambulatory Visit: Payer: Self-pay | Admitting: Physician Assistant

## 2016-12-27 MED ORDER — TOPIRAMATE 100 MG PO TABS: 150.0000 mg | ORAL_TABLET | Freq: Two times a day (BID) | ORAL | 1 refills | Status: DC

## 2017-01-03 ENCOUNTER — Ambulatory Visit (INDEPENDENT_AMBULATORY_CARE_PROVIDER_SITE_OTHER): Payer: 59 | Admitting: Physician Assistant

## 2017-01-03 ENCOUNTER — Encounter: Payer: Self-pay | Admitting: Physician Assistant

## 2017-01-03 VITALS — BP 108/82 | HR 88 | Temp 98.2°F | Resp 14 | Ht 67.0 in | Wt 179.0 lb

## 2017-01-03 DIAGNOSIS — R51 Headache: Secondary | ICD-10-CM

## 2017-01-03 DIAGNOSIS — G8929 Other chronic pain: Secondary | ICD-10-CM

## 2017-01-03 NOTE — Progress Notes (Signed)
Patient presents to clinic today to follow-up for chronic headaches. After last visit, patient has been taking tizanidine as directed. Was helping with acute headaches but they have still been recurring the same. Patient had informed us of this via MyChart. As such, Topamax was increased to 150 mg BID. Has noted good improvement in frequency and severity of headaches with this regimen. X-ray of cervical spine at last visit revealed "Cervical vertebral bodies are preserved in height. The disc space heights are mildly narrowed at multiple levels. The prevertebral soft tissue spaces are normal. There is no perched facet or spinous process fracture. The oblique views reveal mild multilevel bony encroachment upon the neural foramina bilaterally". As such, an attempt was made for MRI but was denied by her insurance. Patient has appt with Neurology scheduled for early next week.   Past Medical History:  Diagnosis Date  . Abdominal pain   . Acid reflux   . Allergy   . Anxiety   . Bell's palsy 01/12/2013  . Depression   . Gall stones   . Headache   . Hyperlipidemia   . Hypothyroidism   . Insomnia   . Migraine   . Nausea & vomiting   . Ovarian cyst   . Wears glasses     Current Outpatient Prescriptions on File Prior to Visit  Medication Sig Dispense Refill  . Bioflavonoid Products (ESTER-C) TABS Take 1,000 each by mouth daily.    . Biotin 16109 MCG TABS Take by mouth daily.    Marland Kitchen buPROPion (WELLBUTRIN XL) 150 MG 24 hr tablet TAKE 3 TABLETS (450 MG TOTAL) BY MOUTH DAILY. 270 tablet 1  . Cholecalciferol (VITAMIN D3) 2000 UNITS TABS Take 5,000 Units by mouth at bedtime.     . Cyanocobalamin (B-12) 3000 MCG CAPS Take by mouth daily.    Marland Kitchen loratadine (CLARITIN) 10 MG tablet Take by mouth daily.    . Multiple Vitamin (MULTIVITAMIN) tablet Take 1 tablet by mouth daily.    Marland Kitchen omeprazole (PRILOSEC) 20 MG capsule Take 1 capsule (20 mg total) by mouth daily. 90 capsule 4  . Probiotic Product (ALIGN) 4  MG CAPS Take 4 mg by mouth daily as needed.     . sertraline (ZOLOFT) 100 MG tablet TAKE 1 TABLET (100 MG TOTAL) BY MOUTH DAILY. 90 tablet 0  . tiZANidine (ZANAFLEX) 4 MG tablet Take 0.5 tablets (2 mg total) by mouth 3 (three) times daily. 15 tablet 1  . topiramate (TOPAMAX) 100 MG tablet Take 1.5 tablets (150 mg total) by mouth 2 (two) times daily. 270 tablet 1   No current facility-administered medications on file prior to visit.     Allergies  Allergen Reactions  . Amoxicillin-Pot Clavulanate Other (See Comments)  . Bactrim [Sulfamethoxazole-Trimethoprim] Nausea Only    Family History  Problem Relation Age of Onset  . Lung cancer Mother 7    Deceased  . Heart disease Father 22    Deceased  . Diabetes Father   . Heart disease Brother     Deceased  . Diabetes Brother   . Lung cancer Maternal Grandmother   . Colon cancer Neg Hx   . Esophageal cancer Neg Hx   . Rectal cancer Neg Hx   . Stomach cancer Neg Hx     Social History   Social History  . Marital status: Married    Spouse name: N/A  . Number of children: N/A  . Years of education: N/A   Social History Main Topics  .  Smoking status: Former Smoker    Quit date: 09/25/1998  . Smokeless tobacco: Never Used  . Alcohol use No     Comment: rare  . Drug use: No  . Sexual activity: Yes    Birth control/ protection: Surgical   Other Topics Concern  . None   Social History Narrative  . None   Review of Systems - See HPI.  All other ROS are negative.  LMP 10/20/1998   Physical Exam  Constitutional: She is oriented to person, place, and time and well-developed, well-nourished, and in no distress.  HENT:  Head: Normocephalic and atraumatic.  Eyes: Conjunctivae are normal. Pupils are equal, round, and reactive to light.  Cardiovascular: Normal rate, regular rhythm, normal heart sounds and intact distal pulses.   Pulmonary/Chest: Effort normal and breath sounds normal. No respiratory distress. She has no wheezes.  She has no rales. She exhibits no tenderness.  Neurological: She is alert and oriented to person, place, and time.  Skin: Skin is warm and dry. No rash noted.  Psychiatric: Affect normal.  Vitals reviewed.  Assessment/Plan: 1. Chronic nonintractable headache, unspecified headache type Multifactorial -- + history of migraines. Recent headaches seem more tension type with associated pain due to likely cervical nerve irritation/compression. Symptoms improved with increased Topamax. Tizanidine helping with acute headaches. Will continue current regimen until assessment with Neurology next week. Will need to attempt to get appeal for MRI. Hopefully Neurology can help with this as well.   Piedad Climes, PA-C

## 2017-01-03 NOTE — Patient Instructions (Signed)
Please continue current regimen.  I am glad things are improving.  Please follow-up with Neurology as scheduled.  We will work on appealing your MRI.   Let me know if any new or worsening symptoms develop.

## 2017-01-03 NOTE — Progress Notes (Signed)
Pre visit review using our clinic review tool, if applicable. No additional management support is needed unless otherwise documented below in the visit note. 

## 2017-01-09 ENCOUNTER — Ambulatory Visit: Payer: 59 | Admitting: Neurology

## 2017-01-11 ENCOUNTER — Encounter: Payer: Self-pay | Admitting: Physician Assistant

## 2017-01-12 ENCOUNTER — Encounter: Payer: Self-pay | Admitting: Physician Assistant

## 2017-01-18 ENCOUNTER — Other Ambulatory Visit: Payer: Self-pay | Admitting: Physician Assistant

## 2017-01-22 ENCOUNTER — Ambulatory Visit (INDEPENDENT_AMBULATORY_CARE_PROVIDER_SITE_OTHER): Payer: 59 | Admitting: Neurology

## 2017-01-22 ENCOUNTER — Encounter: Payer: Self-pay | Admitting: Neurology

## 2017-01-22 ENCOUNTER — Encounter: Payer: Self-pay | Admitting: Physician Assistant

## 2017-01-22 VITALS — BP 115/73 | HR 83 | Ht 67.0 in | Wt 178.0 lb

## 2017-01-22 DIAGNOSIS — R51 Headache: Secondary | ICD-10-CM | POA: Diagnosis not present

## 2017-01-22 DIAGNOSIS — R519 Headache, unspecified: Secondary | ICD-10-CM

## 2017-01-22 DIAGNOSIS — M316 Other giant cell arteritis: Secondary | ICD-10-CM

## 2017-01-22 MED ORDER — PREDNISONE 20 MG PO TABS: 60.0000 mg | ORAL_TABLET | Freq: Every day | ORAL | 0 refills | Status: DC

## 2017-01-22 MED ORDER — SUMATRIPTAN SUCCINATE 100 MG PO TABS: 100.0000 mg | ORAL_TABLET | Freq: Once | ORAL | 12 refills | Status: AC | PRN

## 2017-01-22 MED ORDER — ONDANSETRON 4 MG PO TBDP: 4.0000 mg | ORAL_TABLET | Freq: Three times a day (TID) | ORAL | 0 refills | Status: AC | PRN

## 2017-01-22 NOTE — Patient Instructions (Addendum)
Overall you are doing fairly well but I do want to suggest a few things today:   Remember to drink plenty of fluid, eat healthy meals and do not skip any meals. Try to eat protein with a every meal and eat a healthy snack such as fruit or nuts in between meals. Try to keep a regular sleep-wake schedule and try to exercise daily, particularly in the form of walking, 20-30 minutes a day, if you can.   As far as your medications are concerned, I would like to suggest: Start Prednisone tomorrow with breakfast  At onset of headache take imitrex: Please take one tablet at the onset of your headache and may take with the zofran. If it does not improve the symptoms please take one additional tablet in 2 hours. Do not take more then 2 tablets in 24hrs. Do not take use more then 2 to 3 times in a week.  As far as diagnostic testing: Labs today  I would like to see you back in 6 weeks, sooner if we need to. Please call us with any interim questions, concerns, problems, updates or refill requests.   Our phone number is [phone]. We also have an after hours call service for urgent matters and there is a physician on-call for urgent questions. For any emergencies you know to call 911 or go to the nearest emergency room  Temporal Arteritis Temporal arteritis, also called giant cell arteritis, is a condition that causes arteries to become swollen (inflamed). It usually affects arteries in your head and face, but arteries in any part of the body can become inflamed. Temporal arteritis can cause serious problems, such as bone loss, diabetes, and blindness. What are the causes? The cause is unknown. What increases the risk?  Being older than 50.  Being a woman.  Being Caucasian.  Being of [place], [place], [place], [place], or [place] ancestry.  Having polymyalgia rheumatica (PMR). What are the signs or symptoms? Some people with temporal arteritis have just one symptom, while other have several  symptoms. Most signs and symptoms are related to the head and face. Signs and symptoms may include:  Hard or swollen temples (common). Your temples are the flattened area on either side of your forehead. If your temples are swollen, it may hurt to touch them.  Pain when combing your hair or when laying your head down.  Pain in the jaw when chewing.  Pain in the throat or tongue.  Problems with your vision, such as sudden loss of vision in one eye, or seeing double.  Fever.  Fatigue.  A dry cough.  Pain in the hips and shoulders.  Pain in the arms during exercise.  Depression.  Weight loss. How is this diagnosed? Your health care provider will ask about your symptoms and do a physical exam. He or she may also perform an eye exam and tests, such as:  A complete blood count.  An erythrocyte sedimentation rate test, also called the sed rate test.  A C-reactive protein (CRP) test.  A tissue sample (biopsy) test. How is this treated? Temporal arteritis is treated with a type of medicine called a corticosteroid. Vision problems may be treated with additional medicines. You will need to see your health care provider while you are being treated. During follow-up visits, your health care provider will check for problems by:  Performing blood tests and bone density tests.  Checking your blood pressure and blood sugar. Follow these instructions at home:  Take medicines only  as directed by your health care provider.  Take any vitamins or supplements that your health care provider suggests. These may include vitamin D and calcium, which help keep your bones from becoming weak.  Exercise. Talk with your health care provider about what exercises are okay for you to do. Usually exercises that increase your heart rate (aerobic exercise), such as walking, are recommended. Aerobic exercise helps control your blood pressure and prevent bone loss.  Follow a healthy diet. Include healthy  sources of protein, fruits, vegetables, and whole grains in your diet. Following a healthy diet helps prevent bone damage and diabetes. Contact a health care provider if:  Your symptoms get worse.  Your fever, fatigue, headache, weight loss, or pain in your jaw gets worse.  You develop signs of infection, such as fever, swelling, redness, warmth, and tenderness. Get help right away if:  Your vision gets worse.  Your pain does not go away, even after you take pain medicine.  You have chest pain.  You have trouble breathing.  One side of your face or body suddenly becomes weak or numb. This information is not intended to replace advice given to you by your health care provider. Make sure you discuss any questions you have with your health care provider. Document Released: 07/09/2009 Document Revised: 05/11/2016 Document Reviewed: 11/05/2013 Elsevier Interactive Patient Education  2017 [employer]  Prednisone tablets What is this medicine? PREDNISONE (PRED ni sone) is a corticosteroid. It is commonly used to treat inflammation of the skin, joints, lungs, and other organs. Common conditions treated include asthma, allergies, and arthritis. It is also used for other conditions, such as blood disorders and diseases of the adrenal glands. This medicine may be used for other purposes; ask your health care provider or pharmacist if you have questions. COMMON BRAND NAME(S): Deltasone, Predone, Sterapred, Sterapred DS What should I tell my health care provider before I take this medicine? They need to know if you have any of these conditions: -Cushing's syndrome -diabetes -glaucoma -heart disease -high blood pressure -infection (especially a virus infection such as chickenpox, cold sores, or herpes) -kidney disease -liver disease -mental illness -myasthenia gravis -osteoporosis -seizures -stomach or intestine problems -thyroid disease -an unusual or allergic reaction to lactose,  prednisone, other medicines, foods, dyes, or preservatives -pregnant or trying to get pregnant -breast-feeding How should I use this medicine? Take this medicine by mouth with a glass of water. Follow the directions on the prescription label. Take this medicine with food. If you are taking this medicine once a day, take it in the morning. Do not take more medicine than you are told to take. Do not suddenly stop taking your medicine because you may develop a severe reaction. Your doctor will tell you how much medicine to take. If your doctor wants you to stop the medicine, the dose may be slowly lowered over time to avoid any side effects. Talk to your pediatrician regarding the use of this medicine in children. Special care may be needed. Overdosage: If you think you have taken too much of this medicine contact a poison control center or emergency room at once. NOTE: This medicine is only for you. Do not share this medicine with others. What if I miss a dose? If you miss a dose, take it as soon as you can. If it is almost time for your next dose, talk to your doctor or health care professional. You may need to miss a dose or take an extra dose. Do  not take double or extra doses without advice. What may interact with this medicine? Do not take this medicine with any of the following medications: -metyrapone -mifepristone This medicine may also interact with the following medications: -aminoglutethimide -amphotericin B -aspirin and aspirin-like medicines -barbiturates -certain medicines for diabetes, like glipizide or glyburide -cholestyramine -cholinesterase inhibitors -cyclosporine -digoxin -diuretics -ephedrine -female hormones, like estrogens and birth control pills -isoniazid -ketoconazole -NSAIDS, medicines for pain and inflammation, like ibuprofen or naproxen -phenytoin -rifampin -toxoids -vaccines -warfarin This list may not describe all possible interactions. Give your  health care provider a list of all the medicines, herbs, non-prescription drugs, or dietary supplements you use. Also tell them if you smoke, drink alcohol, or use illegal drugs. Some items may interact with your medicine. What should I watch for while using this medicine? Visit your doctor or health care professional for regular checks on your progress. If you are taking this medicine over a prolonged period, carry an identification card with your name and address, the type and dose of your medicine, and your doctor's name and address. This medicine may increase your risk of getting an infection. Tell your doctor or health care professional if you are around anyone with measles or chickenpox, or if you develop sores or blisters that do not heal properly. If you are going to have surgery, tell your doctor or health care professional that you have taken this medicine within the last twelve months. Ask your doctor or health care professional about your diet. You may need to lower the amount of salt you eat. This medicine may affect blood sugar levels. If you have diabetes, check with your doctor or health care professional before you change your diet or the dose of your diabetic medicine. What side effects may I notice from receiving this medicine? Side effects that you should report to your doctor or health care professional as soon as possible: -allergic reactions like skin rash, itching or hives, swelling of the face, lips, or tongue -changes in emotions or moods -changes in vision -depressed mood -eye pain -fever or chills, cough, sore throat, pain or difficulty passing urine -increased thirst -swelling of ankles, feet Side effects that usually do not require medical attention (report to your doctor or health care professional if they continue or are bothersome): -confusion, excitement, restlessness -headache -nausea, vomiting -skin problems, acne, thin and shiny skin -trouble  sleeping -weight gain This list may not describe all possible side effects. Call your doctor for medical advice about side effects. You may report side effects to FDA at 1-800-FDA-1088. Where should I keep my medicine? Keep out of the reach of children. Store at room temperature between 15 and 30 degrees C (59 and 86 degrees F). Protect from light. Keep container tightly closed. Throw away any unused medicine after the expiration date. NOTE: This sheet is a summary. It may not cover all possible information. If you have questions about this medicine, talk to your doctor, pharmacist, or health care provider.  2018 Elsevier/Gold Standard (2011-04-27 10:57:14)  Ondansetron oral dissolving tablet What is this medicine? ONDANSETRON (on DAN se tron) is used to treat nausea and vomiting caused by chemotherapy. It is also used to prevent or treat nausea and vomiting after surgery. This medicine may be used for other purposes; ask your health care provider or pharmacist if you have questions. COMMON BRAND NAME(S): Zofran ODT What should I tell my health care provider before I take this medicine? They need to know if you have any  of these conditions: -heart disease -history of irregular heartbeat -liver disease -low levels of magnesium or potassium in the blood -an unusual or allergic reaction to ondansetron, granisetron, other medicines, foods, dyes, or preservatives -pregnant or trying to get pregnant -breast-feeding How should I use this medicine? These tablets are made to dissolve in the mouth. Do not try to push the tablet through the foil backing. With dry hands, peel away the foil backing and gently remove the tablet. Place the tablet in the mouth and allow it to dissolve, then swallow. While you may take these tablets with water, it is not necessary to do so. Talk to your pediatrician regarding the use of this medicine in children. Special care may be needed. Overdosage: If you think you  have taken too much of this medicine contact a poison control center or emergency room at once. NOTE: This medicine is only for you. Do not share this medicine with others. What if I miss a dose? If you miss a dose, take it as soon as you can. If it is almost time for your next dose, take only that dose. Do not take double or extra doses. What may interact with this medicine? Do not take this medicine with any of the following medications: -apomorphine -certain medicines for fungal infections like fluconazole, itraconazole, ketoconazole, posaconazole, voriconazole -cisapride -dofetilide -dronedarone -pimozide -thioridazine -ziprasidone This medicine may also interact with the following medications: -carbamazepine -certain medicines for depression, anxiety, or psychotic disturbances -fentanyl -linezolid -MAOIs like Carbex, Eldepryl, Marplan, Nardil, and Parnate -methylene blue (injected into a vein) -other medicines that prolong the QT interval (cause an abnormal heart rhythm) -phenytoin -rifampicin -tramadol This list may not describe all possible interactions. Give your health care provider a list of all the medicines, herbs, non-prescription drugs, or dietary supplements you use. Also tell them if you smoke, drink alcohol, or use illegal drugs. Some items may interact with your medicine. What should I watch for while using this medicine? Check with your doctor or health care professional as soon as you can if you have any sign of an allergic reaction. What side effects may I notice from receiving this medicine? Side effects that you should report to your doctor or health care professional as soon as possible: -allergic reactions like skin rash, itching or hives, swelling of the face, lips, or tongue -breathing problems -confusion -dizziness -fast or irregular heartbeat -feeling faint or lightheaded, falls -fever and chills -loss of balance or  coordination -seizures -sweating -swelling of the hands and feet -tightness in the chest -tremors -unusually weak or tired Side effects that usually do not require medical attention (report to your doctor or health care professional if they continue or are bothersome): -constipation or diarrhea -headache This list may not describe all possible side effects. Call your doctor for medical advice about side effects. You may report side effects to FDA at 1-800-FDA-1088. Where should I keep my medicine? Keep out of the reach of children. Store between 2 and 30 degrees C (36 and 86 degrees F). Throw away any unused medicine after the expiration date. NOTE: This sheet is a summary. It may not cover all possible information. If you have questions about this medicine, talk to your doctor, pharmacist, or health care provider.  2018 Elsevier/Gold Standard (2013-06-18 16:21:52)  Sumatriptan tablets What is this medicine? SUMATRIPTAN (soo ma TRIP tan) is used to treat migraines with or without aura. An aura is a strange feeling or visual disturbance that warns you of an  attack. It is not used to prevent migraines. This medicine may be used for other purposes; ask your health care provider or pharmacist if you have questions. COMMON BRAND NAME(S): Imitrex, Migraine Pack What should I tell my health care provider before I take this medicine? They need to know if you have any of these conditions: -circulation problems in fingers and toes -diabetes -heart disease -high blood pressure -high cholesterol -history of irregular heartbeat -history of stroke -kidney disease -liver disease -postmenopausal or surgical removal of uterus and ovaries -seizures -smoke tobacco -stomach or intestine problems -an unusual or allergic reaction to sumatriptan, other medicines, foods, dyes, or preservatives -pregnant or trying to get pregnant -breast-feeding How should I use this medicine? Take this medicine by  mouth with a glass of water. Follow the directions on the prescription label. This medicine is taken at the first symptoms of a migraine. It is not for everyday use. If your migraine headache returns after one dose, you can take another dose as directed. You must leave at least 2 hours between doses, and do not take more than 100 mg as a single dose. Do not take more than 200 mg total in any 24 hour period. If there is no improvement at all after the first dose, do not take a second dose without talking to your doctor or health care professional. Do not take your medicine more often than directed. Talk to your pediatrician regarding the use of this medicine in children. Special care may be needed. Overdosage: If you think you have taken too much of this medicine contact a poison control center or emergency room at once. NOTE: This medicine is only for you. Do not share this medicine with others. What if I miss a dose? This does not apply; this medicine is not for regular use. What may interact with this medicine? Do not take this medicine with any of the following medicines: -cocaine -ergot alkaloids like dihydroergotamine, ergonovine, ergotamine, methylergonovine -feverfew -MAOIs like Carbex, Eldepryl, Marplan, Nardil, and Parnate -other medicines for migraine headache like almotriptan, eletriptan, frovatriptan, naratriptan, rizatriptan, zolmitriptan -tryptophan This medicine may also interact with the following medications: -certain medicines for depression, anxiety, or psychotic disturbances This list may not describe all possible interactions. Give your health care provider a list of all the medicines, herbs, non-prescription drugs, or dietary supplements you use. Also tell them if you smoke, drink alcohol, or use illegal drugs. Some items may interact with your medicine. What should I watch for while using this medicine? Only take this medicine for a migraine headache. Take it if you get  warning symptoms or at the start of a migraine attack. It is not for regular use to prevent migraine attacks. You may get drowsy or dizzy. Do not drive, use machinery, or do anything that needs mental alertness until you know how this medicine affects you. To reduce dizzy or fainting spells, do not sit or stand up quickly, especially if you are an older patient. Alcohol can increase drowsiness, dizziness and flushing. Avoid alcoholic drinks. Smoking cigarettes may increase the risk of heart-related side effects from using this medicine. If you take migraine medicines for 10 or more days a month, your migraines may get worse. Keep a diary of headache days and medicine use. Contact your healthcare professional if your migraine attacks occur more frequently. What side effects may I notice from receiving this medicine? Side effects that you should report to your doctor or health care professional as soon as possible: -allergic  reactions like skin rash, itching or hives, swelling of the face, lips, or tongue -bloody or watery diarrhea -hallucination, loss of contact with reality -pain, tingling, numbness in the face, hands, or feet -seizures -signs and symptoms of a blood clot such as breathing problems; changes in vision; chest pain; severe, sudden headache; pain, swelling, warmth in the leg; trouble speaking; sudden numbness or weakness of the face, arm, or leg -signs and symptoms of a dangerous change in heartbeat or heart rhythm like chest pain; dizziness; fast or irregular heartbeat; palpitations, feeling faint or lightheaded; falls; breathing problems -signs and symptoms of a stroke like changes in vision; confusion; trouble speaking or understanding; severe headaches; sudden numbness or weakness of the face, arm, or leg; trouble walking; dizziness; loss of balance or coordination -stomach pain Side effects that usually do not require medical attention (report to your doctor or health care  professional if they continue or are bothersome): -changes in taste -facial flushing -headache -muscle cramps -muscle pain -nausea, vomiting -weak or tired This list may not describe all possible side effects. Call your doctor for medical advice about side effects. You may report side effects to FDA at 1-800-FDA-1088. Where should I keep my medicine? Keep out of the reach of children. Store at room temperature between 2 and 30 degrees C (36 and 86 degrees F). Throw away any unused medicine after the expiration date. NOTE: This sheet is a summary. It may not cover all possible information. If you have questions about this medicine, talk to your doctor, pharmacist, or health care provider.  2018 Elsevier/Gold Standard (2015-10-14 12:38:23)

## 2017-01-22 NOTE — Addendum Note (Signed)
Addended by: Naomie Dean B on: 01/22/2017 09:08 AM   Modules accepted: Orders

## 2017-01-22 NOTE — Progress Notes (Addendum)
[facility]    Provider:  Dr Jaynee Eagles Referring Provider: Brunetta Jeans, PA-C Primary Care Physician:  Brunetta Jeans, PA-C  CC:  Neck pain and headaches  HPI:  Margaret Robles is a 56 y.o. female here as a referral from Dr. Hassell Done for neck pain and migraines. Migraines ongoing all her life. At one point she was having migraines every day. She has [facility]ingo [facility]ing [facility]. Her mother and grandmother had migraines. He worked with her extensively to figure out food triggers. She has debilitating pain, vomiting, starts in the front behind the eyes with pounding and throbbing,  She had nerve blocks with Dr. Domingo Cocking. She had 4 injections and started Topamax. She is on Topamax and done very well. She has daily headaches since January. She was in the ED February 24th after she took baclofen and took too much and had to seen in the ED. Going to sleep with a baclofen helps. She was taking baclofen too much. She couldn't breathe and then started vomiting. She did not take too much baclofen on purpose it was in error. The headache can be 10/10 in pain and last up 24 hours. Crying makes her pain worse. She was in the ED at [facility] in February due to a severe headache. No aura. The headache in February lasted longer than 3 days and would not go away. She went to an opthalmologist within the week with an unremarkable exam. The headache never went away. This is different than her migraines. She has pain in the temple area, it is sensitive to the touch, it can occur on either side and fluctuate, throbbing, mostly on the left, she has been having vision changes. Patient has chronic neck pain and has been using tizanidine for several months without relief, radiating into the arms, with weakness, possibly exacerbating her headaches. She is also having vision changes. The headache is positional.   Medications tried: baclofen, Topiramate, zoloft, tizanidine,  Reviewed notes,  labs and imaging from outside physicians, which showed:    Ct showed No acute intracranial abnormalities including mass lesion or mass effect, hydrocephalus, extra-axial fluid collection, midline shift, hemorrhage, or acute infarction, large ischemic events (personally reviewed images)  Hgba1c 5.1, CBC nml, cmp with slightly reduced GFR 52.3  XR cervical spine 11/2016: Reviewed report as below  FINDINGS: Cervical vertebral bodies are preserved in height. The disc space heights are mildly narrowed at multiple levels. The prevertebral soft tissue spaces are normal. There is no perched facet or spinous process fracture. The oblique views reveal mild multilevel bony encroachment upon the neural foramina bilaterally.  IMPRESSION: Mild degenerative disc space narrowing from C3-4 through C6-7. Multilevel mild bony encroachment upon the neural foramina may be impacting the nerve roots. Given the patient's radicular symptoms, cervical spine MRI would be a useful next imaging step.  MRI cervical spine 11/2012: personally reviewed imaging and agree with the following:  Findings: Normal signal is present in the cervical and upper thoracic spinal cord to the lowest imaged level, T2-3.  Marrow signal, vertebral body heights, alignment are normal.  The craniocervical junction is within normal limits.  The visualized intracranial contents are normal.  Flow is present in the major vascular structures of the neck.  The left vertebral artery is dominant.  C2-3:  Negative.  C3-4: A broad-based disc osteophyte complex is present.  Mild uncovertebral disease is present.  Facet hypertrophy is present on the left.  This results in mild to moderate  left and mild right foraminal stenosis.  C4-5:  A broad-based disc osteophyte complex is asymmetric to the right.  Right-sided uncovertebral disease is present as well.  This results in mild right foraminal narrowing.  C5-6:  Asymmetric left-sided  uncovertebral disease is evident. Mild left foraminal narrowing is present.  C6-7:  Asymmetric uncovertebral spurring is present on the left. Mild left foraminal narrowing is present.  C7-T1:  Negative.  IMPRESSION:  1.  Mild to moderate left and mild right foraminal stenosis at C3- 4. 2.  Mild right foraminal narrowing at C4-5. 3.  Mild left foraminal stenosis at C5-6 and C6-7.  Review of Systems: Patient complains of symptoms per HPI as well as the following symptoms: headache, no CP, no SOB. Pertinent negatives per HPI. All others negative.   Social History   Social History  . Marital status: Married    Spouse name: N/A  . Number of children: N/A  . Years of education: N/A   Occupational History  . Not on file.   Social History Main Topics  . Smoking status: Former Smoker    Quit date: 09/25/1998  . Smokeless tobacco: Never Used  . Alcohol use No     Comment: rare  . Drug use: No  . Sexual activity: Yes    Birth control/ protection: Surgical   Other Topics Concern  . Not on file   Social History Narrative  . No narrative on file    Family History  Problem Relation Age of Onset  . Lung cancer Mother 22       Deceased  . Migraines Mother   . Heart disease Father 35       Deceased  . Diabetes Father   . Heart disease Brother        Deceased  . Diabetes Brother   . Lung cancer Maternal Grandmother   . Migraines Maternal Grandmother   . Colon cancer Neg Hx   . Esophageal cancer Neg Hx   . Rectal cancer Neg Hx   . Stomach cancer Neg Hx     Past Medical History:  Diagnosis Date  . Abdominal pain   . Acid reflux   . Allergy   . Anxiety   . Bell's palsy 01/12/2013  . Depression   . Gall stones   . Headache   . Hyperlipidemia   . Hypothyroidism   . Insomnia   . Migraine   . Nausea & vomiting   . Ovarian cyst   . Wears glasses     Past Surgical History:  Procedure Laterality Date  . ABDOMINAL HYSTERECTOMY    . CHOLECYSTECTOMY    .  LAPAROSCOPIC ENDOMETRIOSIS FULGURATION    . WISDOM TOOTH EXTRACTION      Current Outpatient Prescriptions  Medication Sig Dispense Refill  . Bioflavonoid Products (ESTER-C) TABS Take 1,000 each by mouth daily.    . Biotin 10000 MCG TABS Take by mouth daily.    Marland Kitchen buPROPion (WELLBUTRIN XL) 150 MG 24 hr tablet TAKE 3 TABLETS (450 MG TOTAL) BY MOUTH DAILY. 270 tablet 1  . Cholecalciferol (VITAMIN D3) 2000 UNITS TABS Take 5,000 Units by mouth at bedtime.     . Cyanocobalamin (B-12) 3000 MCG CAPS Take by mouth daily.    Marland Kitchen loratadine (CLARITIN) 10 MG tablet Take by mouth daily.    . Multiple Vitamin (MULTIVITAMIN) tablet Take 1 tablet by mouth daily.    Marland Kitchen omeprazole (PRILOSEC) 20 MG capsule Take 1 capsule (20 mg total) by mouth daily.  90 capsule 4  . Probiotic Product (ALIGN) 4 MG CAPS Take 4 mg by mouth daily as needed.     . sertraline (ZOLOFT) 100 MG tablet TAKE 1 TABLET (100 MG TOTAL) BY MOUTH DAILY. 90 tablet 1  . tiZANidine (ZANAFLEX) 4 MG capsule Take 1 capsule by mouth at bedtime.    Marland Kitchen tiZANidine (ZANAFLEX) 4 MG tablet Take 0.5 tablets (2 mg total) by mouth 3 (three) times daily. (Patient taking differently: Take 2 mg by mouth 3 (three) times daily. ) 15 tablet 1  . topiramate (TOPAMAX) 100 MG tablet Take 1.5 tablets (150 mg total) by mouth 2 (two) times daily. 270 tablet 1  . ondansetron (ZOFRAN ODT) 4 MG disintegrating tablet Take 1 tablet (4 mg total) by mouth every 8 (eight) hours as needed for nausea or vomiting. 20 tablet 0  . predniSONE (DELTASONE) 20 MG tablet Take 3 tablets (60 mg total) by mouth daily with breakfast. 90 tablet 0  . SUMAtriptan (IMITREX) 100 MG tablet Take 1 tablet (100 mg total) by mouth once as needed. May repeat in 2 hours if headache persists or recurs. 10 tablet 12   No current facility-administered medications for this visit.     Allergies as of 01/22/2017 - Review Complete 01/22/2017  Allergen Reaction Noted  . Amoxicillin-pot clavulanate Other (See  Comments) 04/15/2016  . Bactrim [sulfamethoxazole-trimethoprim] Nausea Only 04/20/2014    Vitals: BP 115/73 (BP Location: Right Arm, Patient Position: Sitting, Cuff Size: Normal)   Pulse 83   Ht 5' 7"  (1.702 m)   Wt 178 lb (80.7 kg)   LMP 10/20/1998   BMI 27.88 kg/m  Last Weight:  Wt Readings from Last 1 Encounters:  01/22/17 178 lb (80.7 kg)   Last Height:   Ht Readings from Last 1 Encounters:  01/22/17 5' 7"  (1.702 m)    Physical exam: Exam: Gen: NAD, conversant, well nourised, obese, well groomed                     CV: RRR, no MRG. No Carotid Bruits. No peripheral edema, warm, nontender Eyes: Conjunctivae clear without exudates or hemorrhage  Neuro: Detailed Neurologic Exam  Speech:    Speech is normal; fluent and spontaneous with normal comprehension.  Cognition:    The patient is oriented to person, place, and time;     recent and remote memory intact;     language fluent;     normal attention, concentration,     fund of knowledge Cranial Nerves:    The pupils are equal, round, and reactive to light. The fundi are normal and spontaneous venous pulsations are present. Visual fields are full to finger confrontation. Extraocular movements are intact. Trigeminal sensation is intact and the muscles of mastication are normal. The face is symmetric. The palate elevates in the midline. Hearing intact. Voice is normal. Shoulder shrug is normal. The tongue has normal motion without fasciculations.   Coordination:    Normal finger to nose and heel to shin. Normal rapid alternating movements.   Gait:    Heel-toe and tandem gait are normal.   Motor Observation:    No asymmetry, no atrophy, and no involuntary movements noted. Tone:    Normal muscle tone.    Posture:    Posture is normal. normal erect    Strength:    Strength is V/V in the upper and lower limbs.      Sensation: intact to LT     Reflex Exam:  DTR's:  Deep tendon reflexes in the upper and lower  extremities are normal bilaterally.   Toes:    The toes are downgoing bilaterally.   Clonus:    Clonus is absent.      Assessment/Plan:  56 year old with past medical history of migraines however she has new onset of the new daily continuous headache which is much different than her migraines, continuous, severe, positional with vision changes. Need to rule out strokes or other intracranial etiology with MRI of the brain. She also continues to have severe neck pain with radiation down the arms, which could be contributing to her headaches, again 6 weeks of conservative treatment did not help. Need MRI of the cervical spine to evaluate for severe degenerative disease that could be causing her headaches, radiculopathy and cervical intervention.  esr/crp: noml steroids: did not help, tizanidine did not improve either  Addendum: ESR and CRP were normal. Patient's headache did not resolve with steroids, she's had an extended treatment with tizanidine. She continues to have continuous daily headache with continuous neck pain.  Cc: Dr. Hassell Done  Discussed: To prevent or relieve headaches, try the following: Cool Compress. Lie down and place a cool compress on your head.  Avoid headache triggers. If certain foods or odors seem to have triggered your migraines in the past, avoid them. A headache diary might help you identify triggers.  Include physical activity in your daily routine. Try a daily walk or other moderate aerobic exercise.  Manage stress. Find healthy ways to cope with the stressors, such as delegating tasks on your to-do list.  Practice relaxation techniques. Try deep breathing, yoga, massage and visualization.  Eat regularly. Eating regularly scheduled meals and maintaining a healthy diet might help prevent headaches. Also, drink plenty of fluids.  Follow a regular sleep schedule. Sleep deprivation might contribute to headaches Consider biofeedback. With this mind-body technique, you  learn to control certain bodily functions - such as muscle tension, heart rate and blood pressure - to prevent headaches or reduce headache pain.    Proceed to emergency room if you experience new or worsening symptoms or symptoms do not resolve, if you have new neurologic symptoms or if headache is severe, or for any concerning symptom.    Sarina Ill, MD  [facility] [address] [place], [place] [address]  Phone [phone] Fax [phone]

## 2017-01-23 LAB — C-REACTIVE PROTEIN: CRP: 1.9 mg/L (ref 0.0–4.9)

## 2017-01-23 LAB — SEDIMENTATION RATE: Sed Rate: 3 mm/hr (ref 0–40)

## 2017-01-25 ENCOUNTER — Telehealth: Payer: Self-pay

## 2017-01-25 NOTE — Telephone Encounter (Signed)
Called pt w/ normal lab results and steroid tapering instructions. Verbalized understanding and appreciation for call.

## 2017-01-25 NOTE — Telephone Encounter (Signed)
-----   Message from Anson FretAntonia B Ahern, MD sent at 01/23/2017 12:57 PM EDT ----- Labs normal she does not have temporal arteritis, I still would like her to complete a tapering course of steroids to see if this helps her headache. 60mg  for 3 days (3 pills), 40mg  for 2 days, 20mg  for 2 days then stop. thanks

## 2017-01-30 ENCOUNTER — Encounter: Payer: Self-pay | Admitting: Neurology

## 2017-02-05 ENCOUNTER — Other Ambulatory Visit: Payer: Self-pay | Admitting: Neurology

## 2017-02-05 DIAGNOSIS — R51 Headache: Secondary | ICD-10-CM

## 2017-02-05 DIAGNOSIS — M542 Cervicalgia: Secondary | ICD-10-CM

## 2017-02-05 DIAGNOSIS — R519 Headache, unspecified: Secondary | ICD-10-CM

## 2017-02-05 DIAGNOSIS — G8929 Other chronic pain: Secondary | ICD-10-CM

## 2017-02-05 DIAGNOSIS — M5412 Radiculopathy, cervical region: Secondary | ICD-10-CM

## 2017-02-09 ENCOUNTER — Other Ambulatory Visit: Payer: Self-pay | Admitting: Emergency Medicine

## 2017-02-09 MED ORDER — BUPROPION HCL ER (XL) 150 MG PO TB24: ORAL_TABLET | ORAL | 1 refills | Status: DC

## 2017-02-12 ENCOUNTER — Encounter: Payer: Self-pay | Admitting: Physician Assistant

## 2017-02-13 MED ORDER — TIZANIDINE HCL 4 MG PO TABS: 2.0000 mg | ORAL_TABLET | Freq: Three times a day (TID) | ORAL | 3 refills | Status: DC

## 2017-02-15 ENCOUNTER — Encounter: Payer: Self-pay | Admitting: Neurology

## 2017-02-15 MED ORDER — ALPRAZOLAM 0.5 MG PO TABS: ORAL_TABLET | ORAL | 0 refills | Status: AC

## 2017-02-15 NOTE — Telephone Encounter (Signed)
Rx printed, awaiting signature. 

## 2017-02-16 ENCOUNTER — Telehealth: Payer: Self-pay

## 2017-02-16 NOTE — Telephone Encounter (Signed)
Rn fax xanax to [facility] pharmacy at [phone]. Fax receive and confirm.

## 2017-02-17 ENCOUNTER — Other Ambulatory Visit: Payer: Self-pay | Admitting: Physician Assistant

## 2017-02-17 ENCOUNTER — Other Ambulatory Visit: Payer: Self-pay | Admitting: Neurology

## 2017-02-17 DIAGNOSIS — G44229 Chronic tension-type headache, not intractable: Secondary | ICD-10-CM

## 2017-02-25 ENCOUNTER — Other Ambulatory Visit: Payer: Self-pay | Admitting: Physician Assistant

## 2017-02-25 ENCOUNTER — Ambulatory Visit
Admission: RE | Admit: 2017-02-25 | Discharge: 2017-02-25 | Disposition: A | Discharge status: Home / Self Care | Payer: 59 | Source: Ambulatory Visit | Attending: Neurology | Admitting: Neurology

## 2017-02-25 DIAGNOSIS — M5412 Radiculopathy, cervical region: Secondary | ICD-10-CM

## 2017-02-25 DIAGNOSIS — R51 Headache: Secondary | ICD-10-CM

## 2017-02-25 DIAGNOSIS — M542 Cervicalgia: Secondary | ICD-10-CM

## 2017-02-25 DIAGNOSIS — G8929 Other chronic pain: Secondary | ICD-10-CM

## 2017-02-25 DIAGNOSIS — R519 Headache, unspecified: Secondary | ICD-10-CM

## 2017-02-25 DIAGNOSIS — G44229 Chronic tension-type headache, not intractable: Secondary | ICD-10-CM

## 2017-02-25 MED ORDER — GADOBENATE DIMEGLUMINE 529 MG/ML IV SOLN
15.0000 mL | Freq: Once | INTRAVENOUS | Status: AC | PRN
  Administered 2017-02-25: 15 mL via INTRAVENOUS

## 2017-02-28 ENCOUNTER — Telehealth: Payer: Self-pay | Admitting: Neurology

## 2017-02-28 NOTE — Telephone Encounter (Signed)
Called pt w/ MRI results. May call back w/ additional questions/concerns. 

## 2017-02-28 NOTE — Telephone Encounter (Signed)
Margaret Robles, Margaret B, MD  Margaret Robles, Margaret Matty L, RN        She has arthritic and degenerative changes at every level which can cause neck pain. Fortunately no nerve root impingement, the cord looks good, and the changes are stable as compared to 2014.    Margaret Robles, Margaret B, MD  Margaret Robles, Margaret Pollman L, RN        MRI brain normal for age thanks

## 2017-02-28 NOTE — Telephone Encounter (Signed)
Patient calling for MRI results.

## 2017-03-05 ENCOUNTER — Ambulatory Visit: Payer: 59 | Admitting: Neurology

## 2017-06-19 IMAGING — DX DG CERVICAL SPINE COMPLETE 4+V
5 series · 5 of 5 positions shown · non-contrast
Comparison: None in PACs

CLINICAL DATA: Migraines for the past 4 years with recent onset of
different character of headache radiating into the neck and shoulder
blades.

EXAM:
CERVICAL SPINE - COMPLETE 4+ VIEW

[cervical spine lat]
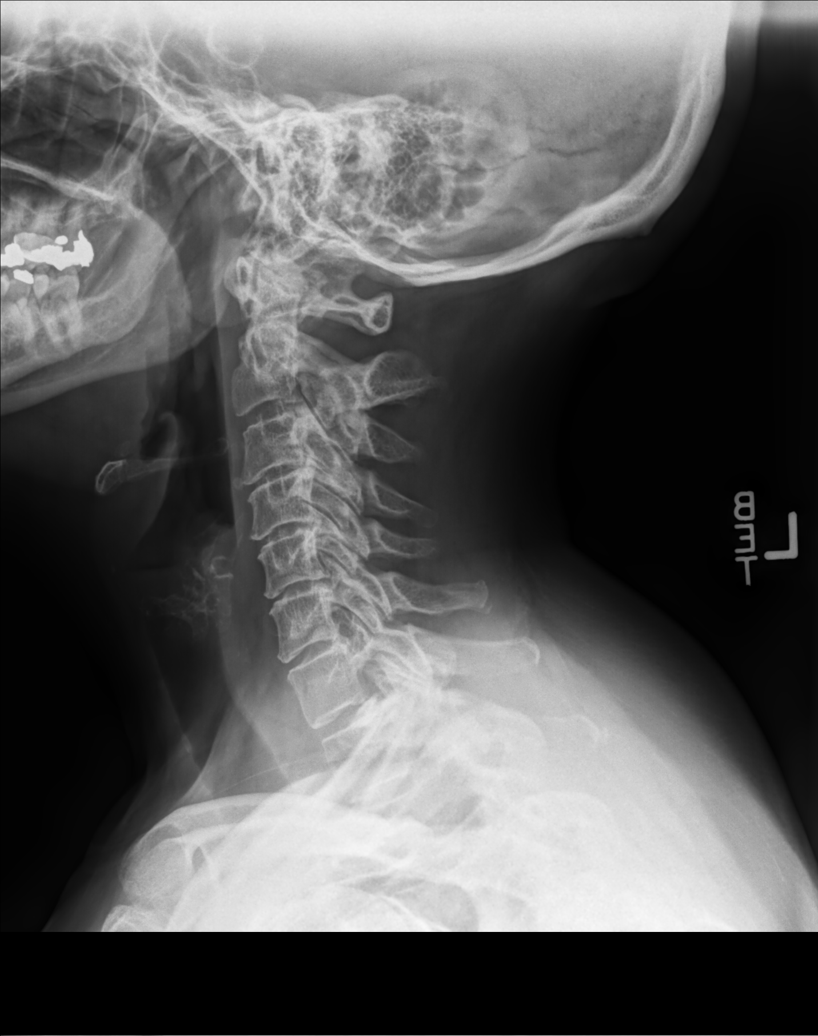

[cervical spine oblique (1 of 2)]
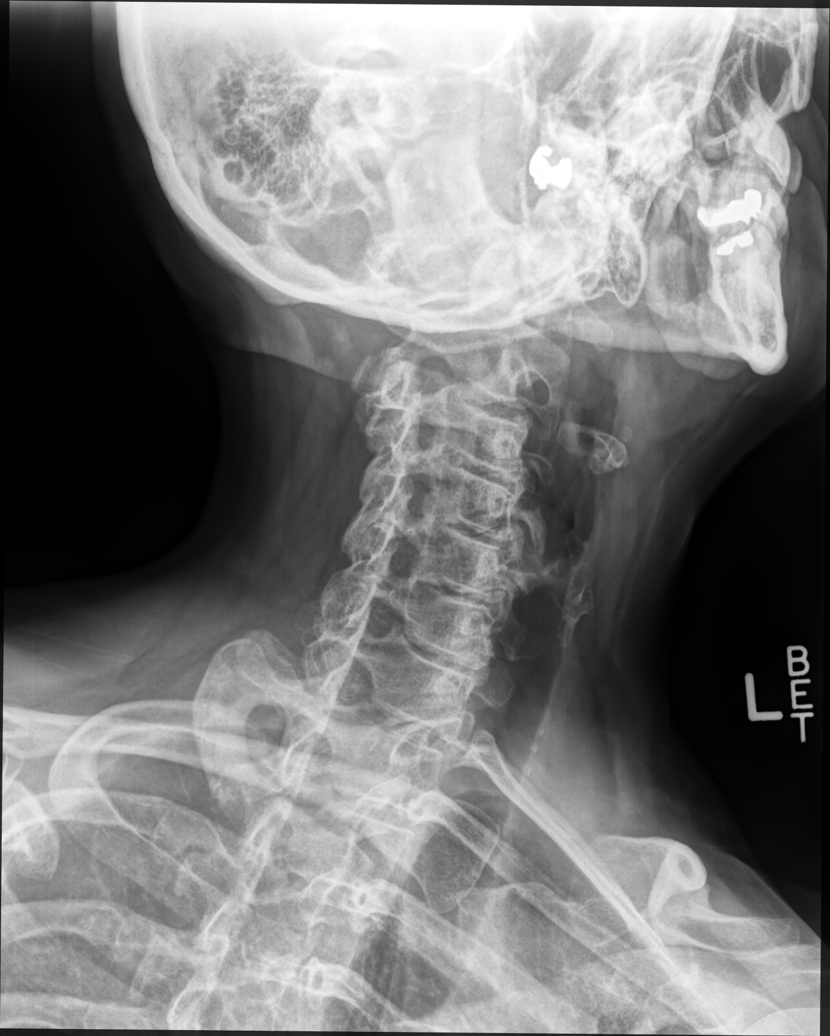

[cervical spine oblique (2 of 2)]
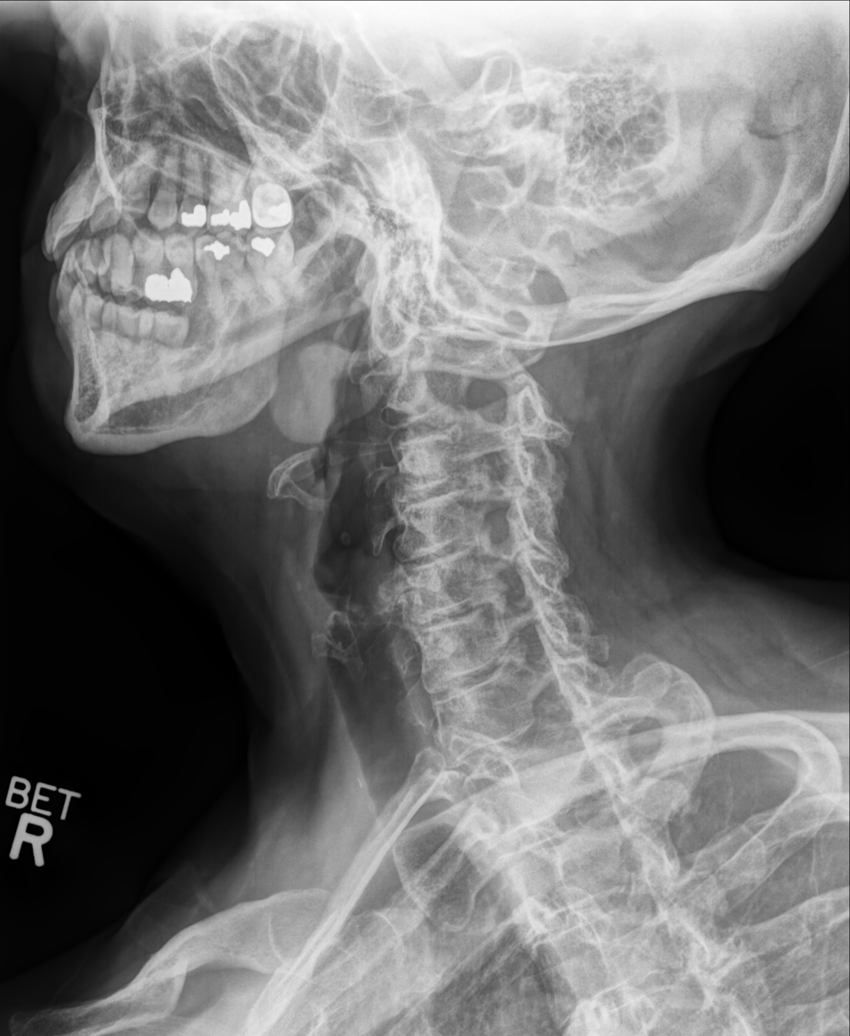

[cervical spine ap]
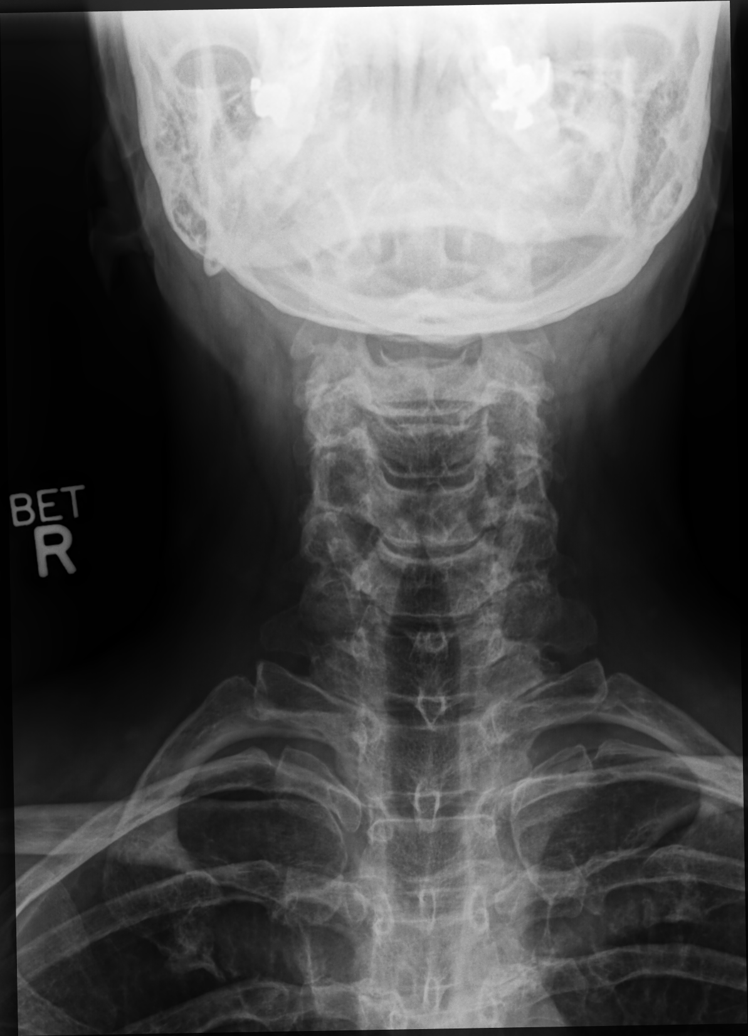

[odontoid]
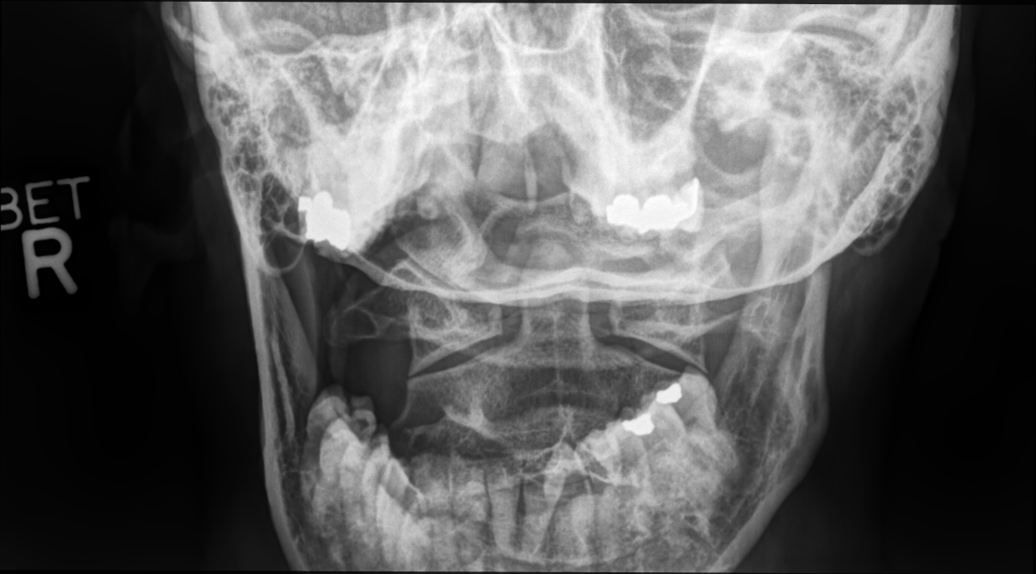

[5 of 5 positions shown; findings below may reference images not displayed]

FINDINGS: Cervical vertebral bodies are preserved in height. The disc space
heights are mildly narrowed at multiple levels. The prevertebral
soft tissue spaces are normal. There is no perched facet or spinous
process fracture. The oblique views reveal mild multilevel bony
encroachment upon the neural foramina bilaterally.
IMPRESSION: Mild degenerative disc space narrowing from C3-4 through C6-7.
Multilevel mild bony encroachment upon the neural foramina may be
impacting the nerve roots. Given the patient's radicular symptoms,
cervical spine MRI would be a useful next imaging step.

## 2017-07-20 ENCOUNTER — Other Ambulatory Visit: Payer: Self-pay | Admitting: Physician Assistant

## 2017-07-29 ENCOUNTER — Other Ambulatory Visit: Payer: Self-pay | Admitting: Physician Assistant

## 2017-08-01 ENCOUNTER — Other Ambulatory Visit: Payer: Self-pay | Admitting: Physician Assistant

## 2017-08-01 DIAGNOSIS — Z1231 Encounter for screening mammogram for malignant neoplasm of breast: Secondary | ICD-10-CM

## 2017-08-19 ENCOUNTER — Other Ambulatory Visit: Payer: Self-pay | Admitting: Physician Assistant

## 2017-08-30 ENCOUNTER — Other Ambulatory Visit: Payer: Self-pay | Admitting: Physician Assistant

## 2017-08-30 ENCOUNTER — Encounter: Payer: Self-pay | Admitting: Physician Assistant

## 2017-08-30 ENCOUNTER — Other Ambulatory Visit: Payer: Self-pay | Admitting: Emergency Medicine

## 2017-08-30 MED ORDER — BUPROPION HCL ER (XL) 150 MG PO TB24: ORAL_TABLET | ORAL | 0 refills | Status: DC

## 2017-08-30 MED ORDER — SERTRALINE HCL 100 MG PO TABS: ORAL_TABLET | ORAL | 0 refills | Status: AC

## 2017-09-03 ENCOUNTER — Ambulatory Visit: Payer: 59

## 2017-09-12 ENCOUNTER — Other Ambulatory Visit: Payer: Self-pay | Admitting: General Practice

## 2017-09-12 MED ORDER — TOPIRAMATE 100 MG PO TABS: 150.0000 mg | ORAL_TABLET | Freq: Two times a day (BID) | ORAL | 0 refills | Status: DC

## 2017-09-12 NOTE — Telephone Encounter (Signed)
Ok to give 1 month supply. Call patient to let her know she will need a follow-up before further refills will be given.

## 2017-09-12 NOTE — Telephone Encounter (Signed)
Received a refill request from pt pharmacy  Last OV 01/03/17 topamax last filled 08/21/17 #[id number] with 0  No upcoming appts.

## 2017-09-12 NOTE — Telephone Encounter (Signed)
Pt made aware and appt was scheduled for 10/03/17 at 4pm.

## 2017-10-03 ENCOUNTER — Ambulatory Visit: Payer: 59 | Admitting: Physician Assistant

## 2017-10-22 ENCOUNTER — Ambulatory Visit: Payer: 59

## 2017-10-23 ENCOUNTER — Other Ambulatory Visit: Payer: Self-pay | Admitting: Physician Assistant

## 2017-12-04 ENCOUNTER — Other Ambulatory Visit: Payer: Self-pay | Admitting: Physician Assistant

## 2017-12-04 ENCOUNTER — Encounter: Payer: Self-pay | Admitting: Emergency Medicine

## 2018-01-10 ENCOUNTER — Other Ambulatory Visit: Payer: Self-pay | Admitting: Physician Assistant

## 2018-09-06 ENCOUNTER — Encounter: Payer: Self-pay | Admitting: Physician Assistant
